# Patient Record
Sex: Male | Born: 1960 | Race: Black or African American | Hispanic: No | Marital: Single | Smoking: Never smoker
Health system: Southern US, Community
[De-identification: ages and names within clinical notes are randomized; demographics above are authoritative.]

## PROBLEM LIST (undated history)

## (undated) DIAGNOSIS — G8929 Other chronic pain: Secondary | ICD-10-CM

## (undated) DIAGNOSIS — M549 Dorsalgia, unspecified: Secondary | ICD-10-CM

## (undated) DIAGNOSIS — E785 Hyperlipidemia, unspecified: Secondary | ICD-10-CM

## (undated) DIAGNOSIS — E119 Type 2 diabetes mellitus without complications: Secondary | ICD-10-CM

## (undated) HISTORY — DX: Other chronic pain: G89.29

## (undated) HISTORY — DX: Dorsalgia, unspecified: M54.9

## (undated) HISTORY — DX: Hyperlipidemia, unspecified: E78.5

---

## 2011-10-23 ENCOUNTER — Encounter: Payer: Self-pay | Admitting: Emergency Medicine

## 2011-10-23 ENCOUNTER — Emergency Department (HOSPITAL_COMMUNITY)
Admission: EM | Admit: 2011-10-23 | Discharge: 2011-10-23 | Disposition: A | Discharge status: Home / Self Care | Payer: Self-pay | Attending: Emergency Medicine | Admitting: Emergency Medicine

## 2011-10-23 DIAGNOSIS — H1130 Conjunctival hemorrhage, unspecified eye: Secondary | ICD-10-CM | POA: Insufficient documentation

## 2011-10-23 DIAGNOSIS — H5789 Other specified disorders of eye and adnexa: Secondary | ICD-10-CM | POA: Insufficient documentation

## 2011-10-23 HISTORY — DX: Other chronic pain: G89.29

## 2011-10-23 HISTORY — DX: Dorsalgia, unspecified: M54.9

## 2011-10-23 NOTE — ED Notes (Signed)
Pt states he takes pain med for his back regularly and pain in R eye has gone from 8 to now a 3 since taking ultram this am. Pt states he had blurry vision early am but vision is back to "same now"

## 2011-10-23 NOTE — ED Notes (Signed)
Per Pt; redness noted at 0800 to right sclera accompanied by itching earlier in day. No other complaints

## 2011-10-23 NOTE — ED Provider Notes (Signed)
History     CSN: [id number] Arrival date & time: 10/23/2011  4:05 PM   First MD Initiated Contact with Patient 10/23/11 1716      Chief Complaint  Patient presents with  . Eye Problem    (Consider location/radiation/quality/duration/timing/severity/associated sxs/prior treatment) HPI Comments: Patient with area of redness noted this morning to right medial aspect of either. Patient denies eye pain or blurry vision. He denies trauma to the eye. Patient denies coughing, vomiting, any forceful injury. No fever or other systemic symptoms. Patient had similar redness several years and was treated for pinkeye.  Patient is a 50 y.o. male presenting with eye problem. The history is provided by the patient.  Eye Problem  This is a new problem. The current episode started 12 to 24 hours ago. The problem occurs constantly. The problem has not changed since onset.There is pain in the right eye. There was no injury mechanism. The patient is experiencing no pain. There is no known exposure to pink eye. He does not wear contacts. Associated symptoms include eye redness and itching. Pertinent negatives include no blurred vision, no discharge, no photophobia, no nausea and no vomiting. He has tried nothing for the symptoms.    Past Medical History  Diagnosis Date  . Asthma   . Back pain, chronic     History reviewed. No pertinent past surgical history.  History reviewed. No pertinent family history.  History  Substance Use Topics  . Smoking status: Never Smoker   . Smokeless tobacco: Not on file  . Alcohol Use: No      Review of Systems  Constitutional: Negative for fever.  HENT: Negative for ear pain, sore throat and rhinorrhea.   Eyes: Positive for redness and itching. Negative for blurred vision, photophobia, discharge and visual disturbance.  Respiratory: Negative for shortness of breath.   Cardiovascular: Negative for chest pain.  Gastrointestinal: Negative for nausea, vomiting and  abdominal pain.  Genitourinary: Negative for dysuria.  Musculoskeletal: Negative for myalgias.  Skin: Positive for itching. Negative for wound.  Neurological: Negative for dizziness and headaches.    Allergies  Review of patient's allergies indicates no known allergies.  Home Medications   Current Outpatient Rx  Name Route Sig Dispense Refill  . ALBUTEROL SULFATE HFA 108 (90 BASE) MCG/ACT IN AERS Inhalation Inhale 2 puffs into the lungs every 6 (six) hours as needed. Shortness of breath     . NABUMETONE 750 MG PO TABS Oral Take 750 mg by mouth daily.      . TRAMADOL HCL 50 MG PO TABS Oral Take 50 mg by mouth every 6 (six) hours as needed. painMaximum dose= 8 tablets per day       BP 119/82  Pulse 81  Temp(Src) 98.6 F (37 C) (Oral)  Resp 18  SpO2 96%  Physical Exam  Nursing note and vitals reviewed. Constitutional: He is oriented to person, place, and time. He appears well-developed and well-nourished.  HENT:  Head: Normocephalic and atraumatic.  Eyes: EOM are normal. Pupils are equal, round, and reactive to light. Right eye exhibits no discharge. Left eye exhibits no discharge. Right conjunctiva is not injected. Right conjunctiva has a hemorrhage. Left conjunctiva is not injected. Left conjunctiva has no hemorrhage.    Neck: Normal range of motion. Neck supple.  Musculoskeletal: He exhibits no edema.  Neurological: He is alert and oriented to person, place, and time.  Skin: Skin is warm and dry.  Psychiatric: He has a normal mood and affect.  ED Course  Procedures (including critical care time)  Labs Reviewed - No data to display No results found.   1. Subconjunctival hemorrhage    6:01 PM patient with subconjunctival hemorrhage. Counseled. Ophthalmology referral given if desired. Patient urged to return with change in vision, pain and high, or purulent discharge. Patient verbalizes understanding and agrees with the plan.   MDM  Patient with subconjunctival  hemorrhage. No history of trauma. No blood thinners. Patient with normal visual acuity and no discharge. Do not suspect iritis or conjunctivitis.        Eustace Moore [place], [place] 10/23/11 [phone]

## 2011-10-23 NOTE — ED Provider Notes (Signed)
Medical screening examination/treatment/procedure(s) were performed by non-physician practitioner and as supervising physician I was immediately available for consultation/collaboration.   Forbes Cellar, MD 10/23/11 2145

## 2012-05-20 ENCOUNTER — Encounter (HOSPITAL_COMMUNITY): Payer: Self-pay | Admitting: *Deleted

## 2012-05-20 ENCOUNTER — Emergency Department (HOSPITAL_COMMUNITY)
Admission: EM | Admit: 2012-05-20 | Discharge: 2012-05-20 | Disposition: A | Discharge status: Home / Self Care | Payer: Self-pay | Attending: Emergency Medicine | Admitting: Emergency Medicine

## 2012-05-20 ENCOUNTER — Emergency Department (HOSPITAL_COMMUNITY): Payer: Self-pay

## 2012-05-20 DIAGNOSIS — G8929 Other chronic pain: Secondary | ICD-10-CM | POA: Insufficient documentation

## 2012-05-20 DIAGNOSIS — J45909 Unspecified asthma, uncomplicated: Secondary | ICD-10-CM | POA: Insufficient documentation

## 2012-05-20 DIAGNOSIS — W268XXA Contact with other sharp object(s), not elsewhere classified, initial encounter: Secondary | ICD-10-CM | POA: Insufficient documentation

## 2012-05-20 DIAGNOSIS — S61509A Unspecified open wound of unspecified wrist, initial encounter: Secondary | ICD-10-CM | POA: Insufficient documentation

## 2012-05-20 DIAGNOSIS — T148XXA Other injury of unspecified body region, initial encounter: Secondary | ICD-10-CM

## 2012-05-20 DIAGNOSIS — Y998 Other external cause status: Secondary | ICD-10-CM | POA: Insufficient documentation

## 2012-05-20 DIAGNOSIS — Y9389 Activity, other specified: Secondary | ICD-10-CM | POA: Insufficient documentation

## 2012-05-20 NOTE — ED Notes (Signed)
Per EMS pt was closing window and when shutting glass broke and punctured R wrist, pinpoint puncture, deep, without pressure blood is flowing, pressure applied. Cap refill +, artrial pulse present, able to move all fingers, no dizziness, no nausea. 150/100, HR 104.

## 2012-05-20 NOTE — ED Notes (Signed)
Rt wrist puncture wound irrigated and cleaned, no bleeding present at the time, dressing reapplied. EDP made aware bleeding has stopped.

## 2012-05-20 NOTE — ED Notes (Signed)
[id number]<AV> Expected date:<BR> Expected time:<BR> Means of arrival:Ambulance<BR> Comments:<BR> Wrist punctured by glass

## 2012-05-20 NOTE — ED Notes (Signed)
Pt given discharge instructions/explained, escorted to discharge window, in no distress upon discharge.

## 2012-05-20 NOTE — ED Provider Notes (Signed)
History     CSN: [id number]  Arrival date & time 05/20/12  1149   First MD Initiated Contact with Patient 05/20/12 1152      No chief complaint on file.   (Consider location/radiation/quality/duration/timing/severity/associated sxs/prior treatment) HPI The patient presents immediately after suffering an accidental stab wound.  He was attempting to lift a glass pane when a broken piece fell onto his right wrist.  Instruct the mid anterior aspect of the wrist.  Subsequently the pain and bleeding began from that area.  He denies distal dysesthesia or weakness.  He also denies any other trauma.  He states that he was in his usual state of health until this occurred Past Medical History  Diagnosis Date  . Asthma   . Back pain, chronic     History reviewed. No pertinent past surgical history.  History reviewed. No pertinent family history.  History  Substance Use Topics  . Smoking status: Never Smoker   . Smokeless tobacco: Never Used  . Alcohol Use: No      Review of Systems  All other systems reviewed and are negative.    Allergies  Review of patient's allergies indicates no known allergies.  Home Medications   Current Outpatient Rx  Name Route Sig Dispense Refill  . ROSUVASTATIN CALCIUM 10 MG PO TABS Oral Take 10 mg by mouth daily.    . ALBUTEROL SULFATE HFA 108 (90 BASE) MCG/ACT IN AERS Inhalation Inhale 2 puffs into the lungs every 6 (six) hours as needed. Shortness of breath     . NABUMETONE 750 MG PO TABS Oral Take 750 mg by mouth 2 (two) times daily.     . TRAMADOL HCL 50 MG PO TABS Oral Take 50 mg by mouth every 6 (six) hours as needed. painMaximum dose= 8 tablets per day       BP 116/78  Pulse 99  Temp 97.8 F (36.6 C) (Oral)  Resp 24  SpO2 100%  Physical Exam  Nursing note and vitals reviewed. Constitutional: He appears well-developed and well-nourished. No distress.  HENT:  Head: Normocephalic and atraumatic.  Eyes: Conjunctivae and EOM are  normal.  Cardiovascular: Normal rate and regular rhythm.   Pulmonary/Chest: Effort normal. No stridor.  Musculoskeletal:       On the anterior aspect of the right wrist just lateral to the flexor digitorum profunda tendon.  Patient not visible, there is active significant bleeding or tender to palpation.  The patient has appropriate range of motion and strength of the wrist and all digits.  Distal cap refill and pulses are appropriate.  Skin: He is not diaphoretic.    ED Course  Procedures (including critical care time)  Labs Reviewed - No data to display No results found.   No diagnosis found.   1:01 PM After irrigation, and pressure, the wound has stopped bleeding. MDM  This gentleman, and in no distress, presents following a stab wound to his right wrist.  Initially there is substantial blood flowing, though no pulsatile flow.  Distal pulses, cap refill, sensation, motor are all appropriate.  X-ray does not demonstrate foreign body.  Wound is irrigated, and with pressure, stopped bleeding.  He was discharged in stable condition.  Gerhard Munch, MD 05/20/12 [phone]

## 2012-05-20 NOTE — ED Notes (Signed)
Pt states was working with an old window, went to shut and glass broke, coming down on R wrist, pinpoint laceration present, pressure dressing on wrist, when dressing removed blood flows out, is not squirting. Pt states R hand feels like it's going numb, denies dizziness. Pt states having R hand pain 10/10. Pt states small amount of blood loss at home, stated he applied pressure right after it happen. Pt a/o x 4.

## 2012-10-19 ENCOUNTER — Encounter (HOSPITAL_COMMUNITY): Payer: Self-pay

## 2012-10-19 ENCOUNTER — Emergency Department (HOSPITAL_COMMUNITY)
Admission: EM | Admit: 2012-10-19 | Discharge: 2012-10-19 | Disposition: A | Discharge status: Home / Self Care | Payer: Self-pay | Source: Home / Self Care

## 2012-10-19 DIAGNOSIS — J45909 Unspecified asthma, uncomplicated: Secondary | ICD-10-CM

## 2012-10-19 DIAGNOSIS — Z23 Encounter for immunization: Secondary | ICD-10-CM

## 2012-10-19 DIAGNOSIS — G8929 Other chronic pain: Secondary | ICD-10-CM

## 2012-10-19 DIAGNOSIS — M549 Dorsalgia, unspecified: Secondary | ICD-10-CM

## 2012-10-19 MED ORDER — TRAMADOL-ACETAMINOPHEN 37.5-325 MG PO TABS: 1.0000 | ORAL_TABLET | Freq: Four times a day (QID) | ORAL | Status: DC | PRN

## 2012-10-19 MED ORDER — INFLUENZA VIRUS VACC SPLIT PF IM SUSP
0.5000 mL | Freq: Once | INTRAMUSCULAR | Status: AC
  Administered 2012-10-19: 0.5 mL via INTRAMUSCULAR

## 2012-10-19 MED ORDER — NABUMETONE 750 MG PO TABS: 750.0000 mg | ORAL_TABLET | Freq: Two times a day (BID) | ORAL | Status: DC

## 2012-10-19 NOTE — ED Provider Notes (Signed)
History     CSN: [id number]  Arrival date & time 10/19/12  1110   First MD Initiated Contact with Patient 10/19/12 1150      Chief Complaint  Patient presents with  . Back Pain     HPI  The patient is a 51 year old African American male who has moved down to [place] from [place], he has a history of chronic back pain and is here requesting refill for his usual narcotic medications and also to see if we can arrange outpatient physical therapy. He has no fever, urinary or fecal incontinence. On neurologic exam he is adequate strength and sensation. He denies any other complaints.  Past Medical History  Diagnosis Date  . Asthma   . Back pain, chronic     History reviewed. No pertinent past surgical history.  No family history on file.  History  Substance Use Topics  . Smoking status: Never Smoker   . Smokeless tobacco: Never Used  . Alcohol Use: No      Review of Systems - No chest pain No shortness of breath No headache No urinary or fecal incontinence No fever No nausea vomiting Abdominal pain No muscle weakness  Allergies  Review of patient's allergies indicates no known allergies.  Home Medications   Current Outpatient Rx  Name  Route  Sig  Dispense  Refill  . HYPROMELLOSE 2.5 % OP SOLN   Both Eyes   Place 2 drops into both eyes 2 (two) times daily as needed. For dry eyes due to allergies         . NABUMETONE 750 MG PO TABS   Oral   Take 1 tablet (750 mg total) by mouth 2 (two) times daily.   30 tablet   0   . TRAMADOL-ACETAMINOPHEN 37.5-325 MG PO TABS   Oral   Take 1 tablet by mouth every 6 (six) hours as needed. For back pain   30 tablet   0     BP 107/70  Pulse 71  Temp 98.6 F (37 C) (Oral)  Resp 19  SpO2 98%  Physical Exam GEN exam-awake and alert Neck supple no JVD Chest bilaterally clear to auscultation CVS-S1-S2 regular no murmurs Abdomen-soft nontender Extremities-no edema Neurology-motor strength 5 x 5 in all 4  extremities. No sensory deficits. Awake and alert, cranial nerves from 2-12 intact  ED Course  Procedures (including critical care time)   Labs Reviewed  CBC  COMPREHENSIVE METABOLIC PANEL  TSH  HEMOGLOBIN A1C  LIPID PANEL   No results found.   1. Chronic back pain greater than 3 months duration   2. Asthma    Patient has a history of chronic back pain, this has been going on for 2-3 years. He is here for management of his pain and also requesting outpatient physical therapy. We have refilled his usual pain medications, a pain contract was given to the patient and he is febrile after treating it and has signed it.   We will give him a two-week supply and check his compliance with medication and followup before resuming narcotic therapy in the long run.  We will check routine lab work which includes A1c, lipid, CBC chemistries and TSH before his next visit. Also get an x-ray of his lumbosacral spine before his next visit.  Health maintenance - Flu vaccination prior to discharge today   MDM  Return to visit in 2 weeks        Maretta Bees, MD 10/19/12 1302

## 2012-10-19 NOTE — ED Notes (Signed)
C/o chronic back pain ran out of medications almost 10 months ago

## 2012-10-21 ENCOUNTER — Ambulatory Visit: Payer: Medicaid Other | Attending: Internal Medicine | Admitting: Physical Therapy

## 2012-10-21 DIAGNOSIS — IMO0001 Reserved for inherently not codable concepts without codable children: Secondary | ICD-10-CM | POA: Insufficient documentation

## 2012-10-21 DIAGNOSIS — M545 Low back pain, unspecified: Secondary | ICD-10-CM | POA: Insufficient documentation

## 2012-10-21 DIAGNOSIS — M25659 Stiffness of unspecified hip, not elsewhere classified: Secondary | ICD-10-CM | POA: Insufficient documentation

## 2012-10-29 ENCOUNTER — Ambulatory Visit: Payer: Medicaid Other | Admitting: Rehabilitation

## 2012-11-02 ENCOUNTER — Encounter (HOSPITAL_COMMUNITY): Payer: Self-pay

## 2012-11-02 ENCOUNTER — Emergency Department (HOSPITAL_COMMUNITY)
Admission: EM | Admit: 2012-11-02 | Discharge: 2012-11-02 | Disposition: A | Discharge status: Home / Self Care | Payer: Medicaid Other | Source: Home / Self Care

## 2012-11-02 DIAGNOSIS — M549 Dorsalgia, unspecified: Secondary | ICD-10-CM

## 2012-11-02 DIAGNOSIS — G8929 Other chronic pain: Secondary | ICD-10-CM

## 2012-11-02 LAB — COMPREHENSIVE METABOLIC PANEL
ALT: 43 U/L (ref 0–53)
AST: 33 U/L (ref 0–37)
Albumin: 4.5 g/dL (ref 3.5–5.2)
Alkaline Phosphatase: 82 U/L (ref 39–117)
BUN: 15 mg/dL (ref 6–23)
CO2: 28 mEq/L (ref 19–32)
Calcium: 9.7 mg/dL (ref 8.4–10.5)
Chloride: 102 mEq/L (ref 96–112)
Creatinine, Ser: 1.12 mg/dL (ref 0.50–1.35)
GFR calc Af Amer: 87 mL/min — ABNORMAL LOW (ref >90)
GFR calc non Af Amer: 75 mL/min — ABNORMAL LOW (ref >90)
Glucose, Bld: 90 mg/dL (ref 70–99)
Potassium: 4.1 mEq/L (ref 3.5–5.1)
Sodium: 140 mEq/L (ref 135–145)
Total Bilirubin: 0.5 mg/dL (ref 0.3–1.2)
Total Protein: 7.8 g/dL (ref 6.0–8.3)

## 2012-11-02 LAB — CBC
HCT: 45.6 % (ref 39.0–52.0)
Hemoglobin: 15.9 g/dL (ref 13.0–17.0)
MCH: 30.8 pg (ref 26.0–34.0)
MCHC: 34.9 g/dL (ref 30.0–36.0)
MCV: 88.4 fL (ref 78.0–100.0)
Platelets: 232 10*3/uL (ref 150–400)
RBC: 5.16 MIL/uL (ref 4.22–5.81)
RDW: 13 % (ref 11.5–15.5)
WBC: 3.8 10*3/uL — ABNORMAL LOW (ref 4.0–10.5)

## 2012-11-02 LAB — HEMOGLOBIN A1C
Hgb A1c MFr Bld: 5.9 % — ABNORMAL HIGH (ref <5.7)
Mean Plasma Glucose: 123 mg/dL — ABNORMAL HIGH (ref <117)

## 2012-11-02 LAB — TSH: TSH: 2.044 u[IU]/mL (ref 0.350–4.500)

## 2012-11-02 NOTE — ED Notes (Signed)
Follow up back pain

## 2012-11-02 NOTE — ED Provider Notes (Signed)
Patient Demographics  Shane Oliver, is a 51 y.o. male  WGN:[id number]  HQI:[id number]  DOB - [birth year]  Chief Complaint  Patient presents with  . Follow-up        Subjective:   Ravon Mortellaro today has, No headache, No chest pain, No abdominal pain - No Nausea, No new weakness tingling or numbness, No Cough - SOB. Chronic nonradiating lower L-spine area back pain, with no associated weakness, no aggravating or relieving factors.  Objective:    Filed Vitals:   11/02/12 1044  BP: 105/69  Pulse: 75  Temp: 98.7 F (37.1 C)  TempSrc: Oral  Resp: 20  SpO2: 100%     Exam  Awake Alert, Oriented X 3, No new F.N deficits, Normal affect [place].AT,PERRAL Supple Neck,No JVD, No cervical lymphadenopathy appriciated.  Symmetrical Chest wall movement, Good air movement bilaterally, CTAB RRR,No Gallops,Rubs or new Murmurs, No Parasternal Heave +ve B.Sounds, Abd Soft, Non tender, No organomegaly appriciated, No rebound - guarding or rigidity. No Cyanosis, Clubbing or edema, No new Rash or bruise      Data Review   CBC No results found for this basename: WBC:5,HGB:5,HCT:5,PLT:5,MCV:5,MCH:5,MCHC:5,RDW:5,NEUTRABS:5,LYMPHSABS:5,MONOABS:5,EOSABS:5,BASOSABS:5,BANDABS:5,BANDSABD:5 in the last 168 hours  Chemistries   No results found for this basename: NA:5,K:5,CL:5,CO2:5,GLUCOSE:5,BUN:5,CREATININE:5,GFRCGP,:5,CALCIUM:5,MG:5,AST:5,ALT:5,ALKPHOS:5,BILITOT:5 in the last 168 hours ------------------------------------------------------------------------------------------------------------------ No results found for this basename: HGBA1C:2 in the last 72 hours ------------------------------------------------------------------------------------------------------------------ No results found for this basename: CHOL:2,HDL:2,LDLCALC:2,TRIG:2,CHOLHDL:2,LDLDIRECT:2 in the last 72  hours ------------------------------------------------------------------------------------------------------------------ No results found for this basename: TSH,T4TOTAL,FREET3,T3FREE,THYROIDAB in the last 72 hours ------------------------------------------------------------------------------------------------------------------ No results found for this basename: VITAMINB12:2,FOLATE:2,FERRITIN:2,TIBC:2,IRON:2,RETICCTPCT:2 in the last 72 hours  Coagulation profile  No results found for this basename: INR:5,PROTIME:5 in the last 168 hours     Prior to Admission medications   Medication Sig Start Date End Date Taking? Authorizing Provider  hydroxypropyl methylcellulose (ISOPTO TEARS) 2.5 % ophthalmic solution Place 2 drops into both eyes 2 (two) times daily as needed. For dry eyes due to allergies    Historical Provider, MD  nabumetone (RELAFEN) 750 MG tablet Take 1 tablet (750 mg total) by mouth 2 (two) times daily. 10/19/12   Shanker Levora Dredge, MD  traMADol-acetaminophen (ULTRACET) 37.5-325 MG per tablet Take 1 tablet by mouth every 6 (six) hours as needed. For back pain 10/19/12   Maretta Bees, MD     Assessment & Plan   Patient with chronic back pain who has a pain contract with clinic, has recently been provided a medications for one month our clinic comes back for routine blood work check.  He has no subjective complaints whatsoever, for his back pain he is taking above dictated medications with good effect, denies any weakness urinary or bowel incontinence, he is following with outpatient physical therapy.   Routine blood work has been ordered today including CBC, CMP, A1c and lipid panel. He will come back in a few days for blood work followup.    Follow-up Information    Follow up with [facility] 1 week for lab result follow up.          Leroy Sea M.D on 11/02/2012 at 11:02 AM   Leroy Sea, MD 11/02/12 [phone]

## 2012-11-05 ENCOUNTER — Ambulatory Visit: Payer: Medicaid Other | Attending: Internal Medicine | Admitting: Physical Therapy

## 2012-11-05 DIAGNOSIS — M25659 Stiffness of unspecified hip, not elsewhere classified: Secondary | ICD-10-CM | POA: Insufficient documentation

## 2012-11-05 DIAGNOSIS — IMO0001 Reserved for inherently not codable concepts without codable children: Secondary | ICD-10-CM | POA: Insufficient documentation

## 2012-11-05 DIAGNOSIS — M545 Low back pain, unspecified: Secondary | ICD-10-CM | POA: Insufficient documentation

## 2012-11-12 ENCOUNTER — Ambulatory Visit: Payer: Medicaid Other | Admitting: Rehabilitation

## 2012-11-19 NOTE — ED Notes (Signed)
Referred to physical therapy had an appt 10/21/12 @ 11am. Pt was seen for his chronic back pain

## 2012-12-02 ENCOUNTER — Ambulatory Visit: Payer: Medicaid Other | Admitting: Family Medicine

## 2012-12-11 ENCOUNTER — Encounter (HOSPITAL_COMMUNITY): Payer: Self-pay | Admitting: *Deleted

## 2012-12-11 ENCOUNTER — Emergency Department (HOSPITAL_COMMUNITY)
Admission: EM | Admit: 2012-12-11 | Discharge: 2012-12-11 | Disposition: A | Discharge status: Home / Self Care | Payer: Medicaid Other | Attending: Emergency Medicine | Admitting: Emergency Medicine

## 2012-12-11 DIAGNOSIS — Z79899 Other long term (current) drug therapy: Secondary | ICD-10-CM | POA: Insufficient documentation

## 2012-12-11 DIAGNOSIS — G8929 Other chronic pain: Secondary | ICD-10-CM | POA: Insufficient documentation

## 2012-12-11 DIAGNOSIS — IMO0002 Reserved for concepts with insufficient information to code with codable children: Secondary | ICD-10-CM | POA: Insufficient documentation

## 2012-12-11 DIAGNOSIS — J45909 Unspecified asthma, uncomplicated: Secondary | ICD-10-CM | POA: Insufficient documentation

## 2012-12-11 DIAGNOSIS — H209 Unspecified iridocyclitis: Secondary | ICD-10-CM | POA: Insufficient documentation

## 2012-12-11 DIAGNOSIS — M479 Spondylosis, unspecified: Secondary | ICD-10-CM

## 2012-12-11 MED ORDER — DIAZEPAM 2 MG PO TABS
2.0000 mg | ORAL_TABLET | Freq: Once | ORAL | Status: AC
  Administered 2012-12-11: 2 mg via ORAL
  Filled 2012-12-11: qty 1

## 2012-12-11 MED ORDER — HYDROCODONE-ACETAMINOPHEN 5-325 MG PO TABS: 1.0000 | ORAL_TABLET | Freq: Four times a day (QID) | ORAL | Status: DC | PRN

## 2012-12-11 MED ORDER — HYDROCODONE-ACETAMINOPHEN 5-325 MG PO TABS
2.0000 | ORAL_TABLET | Freq: Once | ORAL | Status: AC
  Administered 2012-12-11: 2 via ORAL
  Filled 2012-12-11: qty 2

## 2012-12-11 MED ORDER — IBUPROFEN 600 MG PO TABS: 600.0000 mg | ORAL_TABLET | Freq: Four times a day (QID) | ORAL | Status: DC | PRN

## 2012-12-11 MED ORDER — TETRACAINE HCL 0.5 % OP SOLN
1.0000 [drp] | Freq: Once | OPHTHALMIC | Status: AC
  Administered 2012-12-11: 2 [drp] via OPHTHALMIC
  Filled 2012-12-11: qty 2

## 2012-12-11 MED ORDER — IBUPROFEN 200 MG PO TABS
600.0000 mg | ORAL_TABLET | Freq: Once | ORAL | Status: AC
  Administered 2012-12-11: 600 mg via ORAL
  Filled 2012-12-11: qty 3
  Filled 2012-12-11: qty 1

## 2012-12-11 MED ORDER — FLUORESCEIN SODIUM 1 MG OP STRP
1.0000 | ORAL_STRIP | Freq: Once | OPHTHALMIC | Status: AC
  Administered 2012-12-11: 1 via OPHTHALMIC
  Filled 2012-12-11: qty 2

## 2012-12-11 NOTE — ED Notes (Signed)
I did his eye exam his right eye he was 20/20 left eye 20/13.

## 2012-12-11 NOTE — ED Notes (Signed)
Pt c/o L Eye pain and back pain; pt reports back pain started after assisting w/ moving friend.  Left eye noted to be reddened.

## 2012-12-11 NOTE — ED Provider Notes (Signed)
History     CSN: [id number]  Arrival date & time 12/11/12  9562   First MD Initiated Contact with Patient 12/11/12 0759      No chief complaint on file.   (Consider location/radiation/quality/duration/timing/severity/associated sxs/prior treatment) HPI Comments: Pt comes in with cc of left eye pain. Pt states that he woke up with ey eredness and pain. The pain is throbbing, worse with light. There is no n/v/f/c/headaches. Pt does think that his vision is slightly blurry on that side. Pt also c/o some back pain. He has chronic back pain, and is taking tramadol. He was moving some furniture 4 days ago, and that started his pain. No new associated numbness, weakness, urinary incontinence, urinary retention, bowel incontinence, saddle anesthesia.     The history is provided by the patient.    Past Medical History  Diagnosis Date  . Asthma   . Back pain, chronic     History reviewed. No pertinent past surgical history.  History reviewed. No pertinent family history.  History  Substance Use Topics  . Smoking status: Never Smoker   . Smokeless tobacco: Never Used  . Alcohol Use: No      Review of Systems  Constitutional: Negative for fever, chills and activity change.  HENT: Negative for neck pain.   Eyes: Positive for photophobia, pain, discharge, redness, itching and visual disturbance.  Respiratory: Negative for cough, chest tightness and shortness of breath.   Cardiovascular: Negative for chest pain.  Gastrointestinal: Negative for abdominal distention.  Genitourinary: Negative for dysuria, enuresis and difficulty urinating.  Musculoskeletal: Negative for arthralgias.  Neurological: Negative for dizziness, light-headedness and headaches.  Psychiatric/Behavioral: Negative for confusion.    Allergies  Review of patient's allergies indicates no known allergies.  Home Medications   Current Outpatient Rx  Name  Route  Sig  Dispense  Refill  . ALBUTEROL SULFATE  HFA 108 (90 BASE) MCG/ACT IN AERS   Inhalation   Inhale 2 puffs into the lungs every 6 (six) hours as needed. For shortness of breath         . ADULT MULTIVITAMIN W/MINERALS CH   Oral   Take 1 tablet by mouth every morning.         Marland Kitchen NABUMETONE 750 MG PO TABS   Oral   Take 1 tablet (750 mg total) by mouth 2 (two) times daily.   30 tablet   0   . TRAMADOL-ACETAMINOPHEN 37.5-325 MG PO TABS   Oral   Take 1 tablet by mouth every 6 (six) hours as needed. For back pain           BP 126/85  Pulse 75  Temp 98.2 F (36.8 C) (Oral)  Resp 20  SpO2 100%  Physical Exam  Nursing note and vitals reviewed. Constitutional: He is oriented to person, place, and time. He appears well-developed.  HENT:  Head: Normocephalic and atraumatic.  Eyes: Pupils are equal, round, and reactive to light.       Sclera is injected, mild sub conjunctival hemorrhage, no periorbital erythema. Pt has photosensitivity. No foreign body on eversion of lid. Eye pressures - 18, 20 on the left eye Slit lamp exam with fluorecin - no abrasions, ulcers appreciated. No cell flare   Neck: Normal range of motion. Neck supple.  Cardiovascular: Normal rate and regular rhythm.   Pulmonary/Chest: Effort normal and breath sounds normal.  Abdominal: Soft. Bowel sounds are normal. He exhibits no distension. There is no tenderness. There is no rebound and no guarding.  Neurological: He is alert and oriented to person, place, and time.  Skin: Skin is warm.    ED Course  Procedures (including critical care time)  Labs Reviewed - No data to display No results found.   No diagnosis found.    MDM  Pt comes in with cc of left eye pain and redness. Also has some visual complains associated with that side.  Eye exam not indicative of any etiology.  The tearing is clear, and not purulent. No foreign bodies, no ulcer, abrasions. 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  Will get optho f.u.  Back pain - chronic issue, with a flare up and no neuro findings associated with it. Will advocate pain control and exercises.  Derwood Kaplan, MD 12/11/12 [phone]

## 2012-12-11 NOTE — ED Notes (Signed)
Discharge instructions explained to pt and a copy of instructions were given to pt along w/ prescriptions for Norco and Motrin.  Pt verbalized understanding instructions.

## 2012-12-25 ENCOUNTER — Emergency Department (HOSPITAL_COMMUNITY)
Admission: EM | Admit: 2012-12-25 | Discharge: 2012-12-25 | Disposition: A | Discharge status: Home / Self Care | Payer: Medicaid Other | Source: Home / Self Care | Attending: Family Medicine | Admitting: Family Medicine

## 2012-12-25 ENCOUNTER — Encounter (HOSPITAL_COMMUNITY): Payer: Self-pay

## 2012-12-25 DIAGNOSIS — M549 Dorsalgia, unspecified: Secondary | ICD-10-CM

## 2012-12-25 DIAGNOSIS — G8929 Other chronic pain: Secondary | ICD-10-CM

## 2012-12-25 DIAGNOSIS — J45909 Unspecified asthma, uncomplicated: Secondary | ICD-10-CM

## 2012-12-25 DIAGNOSIS — R7303 Prediabetes: Secondary | ICD-10-CM

## 2012-12-25 LAB — LIPID PANEL
Cholesterol: 246 mg/dL — ABNORMAL HIGH (ref 0–200)
HDL: 59 mg/dL (ref >39)
LDL Cholesterol: 149 mg/dL — ABNORMAL HIGH (ref 0–99)
Total CHOL/HDL Ratio: 4.2 RATIO
Triglycerides: 190 mg/dL — ABNORMAL HIGH (ref <150)
VLDL: 38 mg/dL (ref 0–40)

## 2012-12-25 MED ORDER — NABUMETONE 750 MG PO TABS: 750.0000 mg | ORAL_TABLET | Freq: Two times a day (BID) | ORAL | Status: DC | PRN

## 2012-12-25 MED ORDER — TRAMADOL-ACETAMINOPHEN 37.5-325 MG PO TABS: 1.0000 | ORAL_TABLET | Freq: Four times a day (QID) | ORAL | Status: DC | PRN

## 2012-12-25 NOTE — ED Notes (Signed)
Follow up Medication refill 

## 2012-12-25 NOTE — ED Provider Notes (Signed)
History     CSN: [id number]  Arrival date & time 12/25/12  1553   First MD Initiated Contact with Patient 12/25/12 1650      Chief Complaint  Patient presents with  . Medication Refill    (Consider location/radiation/quality/duration/timing/severity/associated sxs/prior treatment) HPI The patient reports that he's been doing fairly well.  He reports that he has not been as physically active as he would like to be because of chronic back pain.  He reports that he has been stable on his medications.  He is asking for refills.  The patient reports that he has had no exacerbations in asthma symptoms.  Past Medical History  Diagnosis Date  . Asthma   . Back pain, chronic     History reviewed. No pertinent past surgical history.  No family history on file.  History  Substance Use Topics  . Smoking status: Never Smoker   . Smokeless tobacco: Never Used  . Alcohol Use: No    Review of Systems  Constitutional: Negative.   Musculoskeletal: Positive for back pain and arthralgias.  All other systems reviewed and are negative.    Allergies  Review of patient's allergies indicates no known allergies.  Home Medications   Current Outpatient Rx  Name  Route  Sig  Dispense  Refill  . albuterol (PROVENTIL HFA;VENTOLIN HFA) 108 (90 BASE) MCG/ACT inhaler   Inhalation   Inhale 2 puffs into the lungs every 6 (six) hours as needed. For shortness of breath         . Multiple Vitamin (MULTIVITAMIN WITH MINERALS) TABS   Oral   Take 1 tablet by mouth every morning.         . nabumetone (RELAFEN) 750 MG tablet   Oral   Take 1 tablet (750 mg total) by mouth 2 (two) times daily as needed for pain.   30 tablet   1   . traMADol-acetaminophen (ULTRACET) 37.5-325 MG per tablet   Oral   Take 1 tablet by mouth every 6 (six) hours as needed. For back pain   30 tablet   1     BP 125/79  Pulse 77  Temp(Src) 98.1 F (36.7 C) (Oral)  SpO2 100%  Physical Exam  Nursing note and  vitals reviewed. Constitutional: He appears well-developed and well-nourished. No distress.  HENT:  Head: Normocephalic and atraumatic.  Eyes: EOM are normal. Pupils are equal, round, and reactive to light.  Neck: Normal range of motion. No JVD present. No tracheal deviation present. No thyromegaly present.  Cardiovascular: Normal rate, regular rhythm and normal heart sounds.   Pulmonary/Chest: Effort normal and breath sounds normal. No respiratory distress. He has no wheezes. He has no rales. He exhibits no tenderness.  Abdominal: Soft. Bowel sounds are normal.  Musculoskeletal:       Right shoulder: He exhibits no swelling.       Lumbar back: He exhibits decreased range of motion, tenderness, bony tenderness, laceration, pain and spasm. He exhibits no swelling, no edema, no deformity and normal pulse.       Arms: Lymphadenopathy:    He has no cervical adenopathy.  Skin: Skin is warm and dry.  Psychiatric: He has a normal mood and affect. His behavior is normal. Judgment and thought content normal.    ED Course  Procedures (including critical care time)  Labs Reviewed  LIPID PANEL   No results found.  IMPRESSION 1. Chronic back pain   2. Prediabetes   3. Asthma -stable  MDM   RECOMMENDATIONS / PLAN Check lipid panel today  Reviewed labs with patient and discussed his prediabetes. Gave patient information on his prediabetes diet and physical activity Refilled ultracet and relafen for his chronic back pain condition Advised that he can't take ibuprofen with these medications, pt verbalized understanding  FOLLOW UP 2 months   The patient was given clear instructions to go to ER or return to medical center if symptoms don't improve, worsen or new problems develop.  The patient verbalized understanding.  The patient was told to call to get lab results if they haven't heard anything in the next week.            Cleora Fleet, MD 12/25/12 2122

## 2012-12-26 ENCOUNTER — Encounter: Payer: Self-pay | Admitting: Family Medicine

## 2012-12-26 DIAGNOSIS — E7800 Pure hypercholesterolemia, unspecified: Secondary | ICD-10-CM | POA: Insufficient documentation

## 2012-12-26 DIAGNOSIS — R7303 Prediabetes: Secondary | ICD-10-CM | POA: Insufficient documentation

## 2012-12-26 DIAGNOSIS — E785 Hyperlipidemia, unspecified: Secondary | ICD-10-CM

## 2012-12-26 DIAGNOSIS — G8929 Other chronic pain: Secondary | ICD-10-CM

## 2012-12-26 DIAGNOSIS — M549 Dorsalgia, unspecified: Secondary | ICD-10-CM | POA: Insufficient documentation

## 2012-12-26 HISTORY — DX: Hyperlipidemia, unspecified: E78.5

## 2012-12-26 HISTORY — DX: Other chronic pain: G89.29

## 2012-12-26 NOTE — Progress Notes (Signed)
Quick Note:  Please inform patient that his cholesterol levels came back mildly elevated. His total cholesterol is 246 and his LDL (bad) cholesterol is 149. I would like to see him have a closer to 200 or below. I would like for him to have an LDL cholesterol closer to 409 because he has prediabetes. I recommend patient watch his cholesterol intake and try a low cholesterol diet. Also, when your blood sugars improve, your cholesterol usually improves. Recheck labs in 3 months.   Shane Langton, MD, CDE, FAAFP [facility] [place], [place]   ______

## 2013-08-12 ENCOUNTER — Other Ambulatory Visit: Payer: Self-pay | Admitting: Family Medicine

## 2013-08-12 NOTE — Telephone Encounter (Signed)
Pt requesting refill

## 2013-08-19 NOTE — Telephone Encounter (Signed)
Pt needs office visit

## 2013-09-06 ENCOUNTER — Telehealth: Payer: Self-pay | Admitting: *Deleted

## 2013-09-06 NOTE — Telephone Encounter (Signed)
[facility] calling to get NPI # for one time visit.  They will instruct patient contact DSS to have PCP changed on card.  NPI # given.  Gaylene Brooks, RN

## 2014-05-28 ENCOUNTER — Emergency Department (HOSPITAL_COMMUNITY)
Admission: EM | Admit: 2014-05-28 | Discharge: 2014-05-28 | Disposition: A | Discharge status: Home / Self Care | Payer: Medicaid Other | Attending: Emergency Medicine | Admitting: Emergency Medicine

## 2014-05-28 ENCOUNTER — Encounter (HOSPITAL_COMMUNITY): Payer: Self-pay | Admitting: Emergency Medicine

## 2014-05-28 DIAGNOSIS — IMO0002 Reserved for concepts with insufficient information to code with codable children: Secondary | ICD-10-CM | POA: Insufficient documentation

## 2014-05-28 DIAGNOSIS — M5416 Radiculopathy, lumbar region: Secondary | ICD-10-CM

## 2014-05-28 DIAGNOSIS — Z79899 Other long term (current) drug therapy: Secondary | ICD-10-CM | POA: Insufficient documentation

## 2014-05-28 DIAGNOSIS — J45909 Unspecified asthma, uncomplicated: Secondary | ICD-10-CM | POA: Insufficient documentation

## 2014-05-28 DIAGNOSIS — E785 Hyperlipidemia, unspecified: Secondary | ICD-10-CM | POA: Insufficient documentation

## 2014-05-28 DIAGNOSIS — M549 Dorsalgia, unspecified: Secondary | ICD-10-CM | POA: Insufficient documentation

## 2014-05-28 DIAGNOSIS — Z791 Long term (current) use of non-steroidal anti-inflammatories (NSAID): Secondary | ICD-10-CM | POA: Diagnosis not present

## 2014-05-28 DIAGNOSIS — G8929 Other chronic pain: Secondary | ICD-10-CM | POA: Diagnosis not present

## 2014-05-28 MED ORDER — PREDNISONE 20 MG PO TABS: 60.0000 mg | ORAL_TABLET | Freq: Every day | ORAL | Status: DC

## 2014-05-28 MED ORDER — HYDROCODONE-ACETAMINOPHEN 5-325 MG PO TABS
1.0000 | ORAL_TABLET | Freq: Once | ORAL | Status: AC
  Administered 2014-05-28: 1 via ORAL
  Filled 2014-05-28: qty 1

## 2014-05-28 MED ORDER — PREDNISONE 20 MG PO TABS
60.0000 mg | ORAL_TABLET | Freq: Once | ORAL | Status: AC
  Administered 2014-05-28: 60 mg via ORAL
  Filled 2014-05-28: qty 3

## 2014-05-28 MED ORDER — HYDROCODONE-ACETAMINOPHEN 5-325 MG PO TABS: ORAL_TABLET | ORAL | Status: DC

## 2014-05-28 NOTE — ED Provider Notes (Signed)
CSN: [id number]     Arrival date & time 05/28/14  1027 History   First MD Initiated Contact with Patient 05/28/14 1043     Chief Complaint  Patient presents with  . Back Pain     (Consider location/radiation/quality/duration/timing/severity/associated sxs/prior Treatment) HPI  Shane Oliver is a 53 y.o. male complaining of exacerbation of chronic back pain which she's had since he was a child. Patient states the pain has increased significantly starting yesterday. Denies trauma. Pain is rated at 7/10, states that he has an appointment with pain management but that won't be until October. States his primary care physician will not write him for pain medication.Denies fever, chills, change in bowel or bladder habits, h/o IDVU or cancer, numbness or weakness.    Past Medical History  Diagnosis Date  . Asthma   . Back pain, chronic   . Hyperlipidemia LDL goal < 100 12/26/2012  . Chronic back pain 12/26/2012   No past surgical history on file. No family history on file. History  Substance Use Topics  . Smoking status: Never Smoker   . Smokeless tobacco: Never Used  . Alcohol Use: No    Review of Systems  10 systems reviewed and found to be negative, except as noted in the HPI.   Allergies  Other  Home Medications   Prior to Admission medications   Medication Sig Start Date End Date Taking? Authorizing Provider  albuterol (PROVENTIL HFA;VENTOLIN HFA) 108 (90 BASE) MCG/ACT inhaler Inhale 2 puffs into the lungs every 6 (six) hours as needed. For shortness of breath   Yes Historical Provider, MD  cholecalciferol (VITAMIN D) 1000 UNITS tablet Take 1,000 Units by mouth daily.   Yes Historical Provider, MD  EPINEPHrine (EPIPEN) 0.3 mg/0.3 mL IJ SOAJ injection Inject 0.3 mg into the muscle once.   Yes Historical Provider, MD  gabapentin (NEURONTIN) 300 MG capsule Take 300 mg by mouth 3 (three) times daily.   Yes Historical Provider, MD  ibuprofen (ADVIL,MOTRIN) 200 MG tablet Take  400 mg by mouth every 6 (six) hours as needed for moderate pain.   Yes Historical Provider, MD  nabumetone (RELAFEN) 750 MG tablet Take 1 tablet (750 mg total) by mouth 2 (two) times daily as needed for pain. 12/25/12  Yes Cl[place] Cyndie Mull, MD  pravastatin (PRAVACHOL) 20 MG tablet Take 20 mg by mouth daily.   Yes Historical Provider, MD  HYDROcodone-acetaminophen (NORCO/VICODIN) 5-325 MG per tablet Take 1-2 tablets by mouth every 6 hours as needed for pain. 05/28/14   Holbert Caples, PA-C  predniSONE (DELTASONE) 20 MG tablet Take 3 tablets (60 mg total) by mouth daily. Take 60 mg by mouth daily week 1, then 40mg  by mouth daily for week 2, then 20mg  daily for week 3 05/28/14   Joni Reining Jon Lall, PA-C   BP 120/88  Pulse 86  Temp(Src) 98.4 F (36.9 C) (Oral)  Resp 18  SpO2 100% Physical Exam  Nursing note and vitals reviewed. Constitutional: He appears well-developed and well-nourished.  HENT:  Head: Normocephalic.  Eyes: Conjunctivae are normal.  Neck: Normal range of motion.  Cardiovascular: Normal rate, regular rhythm and intact distal pulses.   Pulmonary/Chest: Effort normal.  Abdominal: Soft. There is no tenderness.  Neurological: He is alert.  No point tenderness to percussion of lumbar spinal processes.  No TTP or paraspinal muscular spasm. Strength is 5 out of 5 to bilateral lower extremities at hip and knee; extensor hallucis longus 5 out of 5. Ankle strength 5 out of 5,  no clonus, neurovascularly intact. No saddle anaesthesia. Patellar reflexes are 2+ bilaterally.    Ambulates with nonantalgic gait   Psychiatric: He has a normal mood and affect.    ED Course  Procedures (including critical care time) Labs Review Labs Reviewed - No data to display  Imaging Review No results found.   EKG Interpretation None      MDM   Final diagnoses:  Lumbar radiculopathy, chronic    Filed Vitals:   05/28/14 1034  BP: 120/88  Pulse: 86  Temp: 98.4 F (36.9 C)  TempSrc:  Oral  Resp: 18  SpO2: 100%    Medications  predniSONE (DELTASONE) tablet 60 mg (60 mg Oral Given 05/28/14 1104)  HYDROcodone-acetaminophen (NORCO/VICODIN) 5-325 MG per tablet 1 tablet (1 tablet Oral Given 05/28/14 1104)    Shane Oliver is a 53 y.o. male presenting with  back pain.  No neurological deficits and normal neuro exam.  Patient can walk but states is painful.  No loss of bowel or bladder control.  No concern for cauda equina.  No fever, night sweats, weight loss, h/o cancer, IVDU.  RICE protocol and pain medicine indicated and discussed with patient.  Evaluation does not show pathology that would require ongoing emergent intervention or inpatient treatment. Pt is hemodynamically stable and mentating appropriately. Discussed findings and plan with patient/guardian, who agrees with care plan. All questions answered. Return precautions discussed and outpatient follow up given.   Discharge Medication List as of 05/28/2014 11:04 AM    START taking these medications   Details  HYDROcodone-acetaminophen (NORCO/VICODIN) 5-325 MG per tablet Take 1-2 tablets by mouth every 6 hours as needed for pain., Print    predniSONE (DELTASONE) 20 MG tablet Take 3 tablets (60 mg total) by mouth daily. Take 60 mg by mouth daily week 1, then 40mg  by mouth daily for week 2, then 20mg  daily for week 3, Starting 05/28/2014, Until Discontinued, Print             Shane Emeryicole Sharonda Llamas, PA-C 05/29/14 1956

## 2014-05-28 NOTE — Discharge Instructions (Signed)
Please take ibuprofen 400mg  (this is normally 2 over the counter pills) every 6 hours (take with food to minimze stomach irritation).   Take Vicodin for breakthrough pain, do not drink alcohol, drive, care for children or perfom other critical tasks while taking vicodin.  Please follow with your primary care doctor in the next 2 days for a check-up. They must obtain records for further management.   Do not hesitate to return to the Emergency Department for any new, worsening or concerning symptoms.    Lumbosacral Radiculopathy Lumbosacral radiculopathy is a pinched nerve or nerves in the low back (lumbosacral area). When this happens you may have weakness in your legs and may not be able to stand on your toes. You may have pain going down into your legs. There may be difficulties with walking normally. There are many causes of this problem. Sometimes this may happen from an injury, or simply from arthritis or boney problems. It may also be caused by other illnesses such as diabetes. If there is no improvement after treatment, further studies may be done to find the exact cause. DIAGNOSIS  X-rays may be needed if the problems become long standing. Electromyograms may be done. This study is one in which the working of nerves and muscles is studied. HOME CARE INSTRUCTIONS   Applications of ice packs may be helpful. Ice can be used in a plastic bag with a towel around it to prevent frostbite to skin. This may be used every 2 hours for 20 to 30 minutes, or as needed, while awake, or as directed by your caregiver.  Only take over-the-counter or prescription medicines for pain, discomfort, or fever as directed by your caregiver.  If physical therapy was prescribed, follow your caregiver's directions. SEEK IMMEDIATE MEDICAL CARE IF:   You have pain not controlled with medications.  You seem to be getting worse rather than better.  You develop increasing weakness in your legs.  You develop loss of  bowel or bladder control.  You have difficulty with walking or balance, or develop clumsiness in the use of your legs.  You have a fever. MAKE SURE YOU:   Understand these instructions.  Will watch your condition.  Will get help right away if you are not doing well or get worse. Document Released: 10/21/2005 Document Revised: 01/13/2012 Document Reviewed: 06/10/2008 [facility] Patient Information 2015 [employer], [place]. This information is not intended to replace advice given to you by your health care provider. Make sure you discuss any questions you have with your health care provider.

## 2014-05-28 NOTE — ED Notes (Addendum)
Pt c/o back pain x 38 years.  C/o increased pain since yesterday.  Denies injury.

## 2014-05-30 NOTE — ED Provider Notes (Signed)
Medical screening examination/treatment/procedure(s) were performed by non-physician practitioner and as supervising physician I was immediately available for consultation/collaboration.    Linwood DibblesJon Uri Turnbough, MD 05/30/14 [phone]

## 2015-01-03 ENCOUNTER — Other Ambulatory Visit (HOSPITAL_COMMUNITY): Payer: Self-pay | Admitting: Orthopaedic Surgery

## 2015-01-03 DIAGNOSIS — M5440 Lumbago with sciatica, unspecified side: Secondary | ICD-10-CM

## 2015-01-17 ENCOUNTER — Ambulatory Visit (HOSPITAL_COMMUNITY)
Admission: RE | Admit: 2015-01-17 | Discharge: 2015-01-17 | Disposition: A | Discharge status: Home / Self Care | Payer: Medicaid Other | Source: Ambulatory Visit | Attending: Orthopaedic Surgery | Admitting: Orthopaedic Surgery

## 2015-01-17 DIAGNOSIS — M5440 Lumbago with sciatica, unspecified side: Secondary | ICD-10-CM | POA: Diagnosis not present

## 2016-08-30 ENCOUNTER — Ambulatory Visit: Payer: Medicaid Other | Attending: Anesthesiology

## 2016-08-30 DIAGNOSIS — R293 Abnormal posture: Secondary | ICD-10-CM | POA: Diagnosis present

## 2016-08-30 DIAGNOSIS — M2560 Stiffness of unspecified joint, not elsewhere classified: Secondary | ICD-10-CM

## 2016-08-30 DIAGNOSIS — M5442 Lumbago with sciatica, left side: Secondary | ICD-10-CM | POA: Insufficient documentation

## 2016-08-30 DIAGNOSIS — M25651 Stiffness of right hip, not elsewhere classified: Secondary | ICD-10-CM | POA: Diagnosis present

## 2016-08-30 DIAGNOSIS — M25652 Stiffness of left hip, not elsewhere classified: Secondary | ICD-10-CM | POA: Diagnosis present

## 2016-08-30 DIAGNOSIS — G8929 Other chronic pain: Secondary | ICD-10-CM
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
## 2017-08-26 ENCOUNTER — Emergency Department (HOSPITAL_COMMUNITY)
Admission: EM | Admit: 2017-08-26 | Discharge: 2017-08-26 | Disposition: A | Discharge status: Home / Self Care | Payer: Medicaid Other | Attending: Emergency Medicine | Admitting: Emergency Medicine

## 2017-08-26 ENCOUNTER — Encounter (HOSPITAL_COMMUNITY): Payer: Self-pay

## 2017-08-26 DIAGNOSIS — M545 Low back pain: Secondary | ICD-10-CM | POA: Diagnosis present

## 2017-08-26 DIAGNOSIS — M5442 Lumbago with sciatica, left side: Secondary | ICD-10-CM | POA: Insufficient documentation

## 2017-08-26 DIAGNOSIS — J45909 Unspecified asthma, uncomplicated: Secondary | ICD-10-CM | POA: Insufficient documentation

## 2017-08-26 DIAGNOSIS — G8929 Other chronic pain: Secondary | ICD-10-CM | POA: Insufficient documentation

## 2017-08-26 MED ORDER — PREDNISONE 20 MG PO TABS: 60.0000 mg | ORAL_TABLET | Freq: Every day | ORAL | 0 refills | Status: AC

## 2017-08-26 MED ORDER — HYDROCODONE-ACETAMINOPHEN 5-325 MG PO TABS
1.0000 | ORAL_TABLET | Freq: Once | ORAL | Status: AC
  Administered 2017-08-26: 1 via ORAL
  Filled 2017-08-26: qty 1

## 2017-08-26 MED ORDER — PREDNISONE 20 MG PO TABS
60.0000 mg | ORAL_TABLET | Freq: Once | ORAL | Status: AC
  Administered 2017-08-26: 60 mg via ORAL
  Filled 2017-08-26: qty 3

## 2017-08-26 NOTE — ED Notes (Signed)
Brooke - RN informed of pt's BP.

## 2017-08-26 NOTE — ED Triage Notes (Signed)
Patient complains of lower back pain that he describes as chronic and this am developed increased pain, denies trauma. NAD

## 2017-08-26 NOTE — Discharge Instructions (Signed)
You presented to ED for worsening chronic back pain.    Please take prednisone as prescribed. Go to pharmacy and refill your pain medications. Additionally, you can take 500 mg tylenol every 8 hours. Heating pad and light massage may also help.

## 2017-08-26 NOTE — ED Provider Notes (Signed)
[facility] EMERGENCY DEPARTMENT Provider Note   CSN: [id number] Arrival date & time: 08/26/17  1014     History   Chief Complaint No chief complaint on file.   HPI Shane Oliver is a 56 y.o. male w/ h/o chronic back pain from childhood accident presents to ED for evaluation of acute on chronic lumbar back pain onset this morning when getting out of bed. States pain is typical to chronic low back pain but more intense, starts in lumbar back and shoots down bilateral legs worse on the L. Worse with movement, palpation and bending forward.  Take hydrycodone for pain, last dose last night. Ran out of hydrocodone today. Has refill ready for him at pharmacy as soon as tomorrow. Denies falls, trauma, fevers, chills, b/b incontinence or retention, abdominal pain, dysuria, hematuria, rashes, numbness or weakness to legs. No h/o DM or IVDU. Pt states he had a brief episode of clamminess and "hot flashes" while waiting to be seen, went to bathroom and had a big BM. Hot flashes now improving.   HPI  Past Medical History:  Diagnosis Date  . Asthma   . Back pain, chronic   . Chronic back pain 12/26/2012  . Hyperlipidemia LDL goal < 100 12/26/2012    Patient Active Problem List   Diagnosis Date Noted  . Prediabetes 12/26/2012  . Elevated LDL cholesterol level 12/26/2012  . Hyperlipidemia LDL goal < 100 12/26/2012  . Chronic back pain 12/26/2012    History reviewed. No pertinent surgical history.     Home Medications    Prior to Admission medications   Medication Sig Start Date End Date Taking? Authorizing Provider  albuterol (PROVENTIL HFA;VENTOLIN HFA) 108 (90 BASE) MCG/ACT inhaler Inhale 2 puffs into the lungs every 6 (six) hours as needed. For shortness of breath   Yes [provider]  HYDROcodone-acetaminophen (NORCO/VICODIN) 5-325 MG per tablet Take 1-2 tablets by mouth every 6 hours as needed for pain. 05/28/14  Yes Pisciotta, Joni Reining, PA-C    ibuprofen (ADVIL,MOTRIN) 200 MG tablet Take 400 mg by mouth every 6 (six) hours as needed for moderate pain.   Yes [provider]  cholecalciferol (VITAMIN D) 1000 UNITS tablet Take 1,000 Units by mouth daily.    [provider]  EPINEPHrine (EPIPEN) 0.3 mg/0.3 mL IJ SOAJ injection Inject 0.3 mg into the muscle once.    [provider]  nabumetone (RELAFEN) 750 MG tablet Take 1 tablet (750 mg total) by mouth 2 (two) times daily as needed for pain. Patient not taking: Reported on 08/26/2017 12/25/12   Cleora Fleet, MD  predniSONE (DELTASONE) 20 MG tablet Take 3 tablets (60 mg total) by mouth daily. Take 60 mg by mouth daily week 1, then 40mg  by mouth daily for week 2, then 20mg  daily for week 3 08/26/17 08/31/17  Liberty Handy, PA-C    Family History No family history on file.  Social History Social History  Substance Use Topics  . Smoking status: Never Smoker  . Smokeless tobacco: Never Used  . Alcohol use No     Allergies   Other   Review of Systems Review of Systems  Constitutional: Negative for chills and fever.  Gastrointestinal: Negative for abdominal pain, constipation, diarrhea, nausea and vomiting.  Genitourinary: Negative for difficulty urinating.  Musculoskeletal: Positive for back pain and myalgias. Negative for neck pain and neck stiffness.  Skin: Negative for rash.  Allergic/Immunologic: Negative for immunocompromised state.  Neurological: Negative for weakness and numbness.  Physical Exam Updated Vital Signs BP 100/75 (BP Location: Right Arm)   Pulse 85   Temp 98 F (36.7 C) (Oral)   Resp 16   SpO2 100%   Physical Exam  Constitutional: He appears well-developed and well-nourished. No distress.  HENT:  Head: Normocephalic and atraumatic.  Nose: Nose normal.  Eyes: EOM are normal.  Neck:  No midline cervical spine tenderness No cervical paraspinal muscular tenderness or increased tone Full AROM of cervical  spine without pain or rigidity   Cardiovascular: Normal rate, S1 normal, S2 normal and normal heart sounds.   Pulses:      Radial pulses are 2+ on the right side, and 2+ on the left side.       Dorsalis pedis pulses are 2+ on the right side, and 2+ on the left side.  Pulmonary/Chest: Effort normal and breath sounds normal. He has no decreased breath sounds. He exhibits no tenderness.  Abdominal: Soft. Normal appearance and bowel sounds are normal. There is no tenderness.  No suprapubic or CVA tenderness   Musculoskeletal: He exhibits tenderness.       Lumbar back: He exhibits tenderness and pain.  No CTL spine midline tenderness +L spine paraspinal muscular tenderness L>R  Full AROM of T/L spine, pain with hip flexion L>R +L SI joint and sciatic notch tender +Positive SLR, bilateral Negative Pearlean Brownie.  Negative Stinchfield test.   Neurological:  5/5 strength with flexion/extension of hip, knee and ankle, bilaterally.  Sensation to light touch intact in lower extremities including feet  Skin: Skin is warm and dry. Capillary refill takes less than 2 seconds.  Psychiatric: He has a normal mood and affect. His behavior is normal. Judgment and thought content normal.     ED Treatments / Results  Labs (all labs ordered are listed, but only abnormal results are displayed) Labs Reviewed - No data to display  EKG  EKG Interpretation None       Radiology No results found.  Procedures Procedures (including critical care time)  Medications Ordered in ED Medications  HYDROcodone-acetaminophen (NORCO/VICODIN) 5-325 MG per tablet 1 tablet (1 tablet Oral Given 08/26/17 1302)  predniSONE (DELTASONE) tablet 60 mg (60 mg Oral Given 08/26/17 1301)     Initial Impression / Assessment and Plan / ED Course  I have reviewed the triage vital signs and the nursing notes.  Pertinent labs & imaging results that were available during my care of the patient were reviewed by me and considered in  my medical decision making (see chart for details).    Patient is a 56 y.o. male with a hx of chronic back pain presents for acute on chronic back pain. No falls or trauma.   On exam pt has VSS, abdominal exam reassuring without suprapubic or CVAT. Distal pulses symmetric bilaterally.  No focal neurological deficits appreciated. Patient is ambulatory. Considered ruptured disc, UTI/pyelo, PID, kidney stone, cauda equina or epidural abscess however these don't fit clinical picture. Most likely chronic DDD vs sciatica vs acute exacerbation of chronic LB injury.  No red flag symptoms of back pain including: bladder/bowel incontinence or retention, night sweats, night pain, fevers or weight loss, h/o cancer, IVDU, recent trauma or falls. Lab work or imaging not indicated today. Will give prednisone, encourage NSAIDs. He has rx for hydrocodone to pick up tomorrow from pharmacy, will give one dose here. PCP follow-up if no improvement with conservative management. ED return precautions discussed with patient who verbalized understanding and is agreeable to plan.  Pt had brief episode of what sounds like vasovagal symptoms before/during BM in ED. His BP dropped to 90 SBP. He has no other complaints and symptoms have resolved. Has not had anything to drink today so suspect vasovagal vs orthostatic. No CP, SOB, dizziness. Orally hydrated and gave food, EKG normal. Adequate for d/c. He is agreeable with d/c. Discussed s/s that would warrant return to ED. Patient, ED treatment and discharge plan was discussed with supervising physician who is agreeable with plan.   Final Clinical Impressions(s) / ED Diagnoses   Final diagnoses:  Chronic bilateral low back pain with left-sided sciatica    New Prescriptions Discharge Medication List as of 08/26/2017  2:18 PM       Liberty HandyGibbons, Ardis Lawley J, PA-C 08/26/17 1507    Pricilla LovelessGoldston, Scott, MD 08/26/17 1710

## 2017-08-26 NOTE — ED Notes (Signed)
Reassessed pt after he reported to EMT that he was having hot flashes. Pt alert and oriented, does appear slightly pale in color but states he just went to the restroom and had a bowel movement. Skin warm and dry. Rechecked BP which is now 92/73.

## 2017-08-26 NOTE — ED Notes (Signed)
Pt's family member stated that pt was "having hot flashes". Pt's vital signs were reassessed and Nehemiah SettleBrooke - RN was informed.

## 2018-03-16 ENCOUNTER — Emergency Department (HOSPITAL_COMMUNITY)
Admission: EM | Admit: 2018-03-16 | Discharge: 2018-03-16 | Disposition: A | Discharge status: Home / Self Care | Payer: Medicaid Other | Attending: Emergency Medicine | Admitting: Emergency Medicine

## 2018-03-16 ENCOUNTER — Other Ambulatory Visit: Payer: Self-pay

## 2018-03-16 ENCOUNTER — Encounter (HOSPITAL_COMMUNITY): Payer: Self-pay

## 2018-03-16 DIAGNOSIS — M5442 Lumbago with sciatica, left side: Secondary | ICD-10-CM | POA: Diagnosis not present

## 2018-03-16 DIAGNOSIS — M549 Dorsalgia, unspecified: Secondary | ICD-10-CM | POA: Diagnosis present

## 2018-03-16 DIAGNOSIS — Z79899 Other long term (current) drug therapy: Secondary | ICD-10-CM | POA: Diagnosis not present

## 2018-03-16 DIAGNOSIS — J45909 Unspecified asthma, uncomplicated: Secondary | ICD-10-CM | POA: Diagnosis not present

## 2018-03-16 DIAGNOSIS — G8929 Other chronic pain: Secondary | ICD-10-CM

## 2018-03-16 MED ORDER — METHYLPREDNISOLONE 4 MG PO TBPK: ORAL_TABLET | ORAL | 0 refills | Status: DC

## 2018-03-16 MED ORDER — METHOCARBAMOL 500 MG PO TABS: 500.0000 mg | ORAL_TABLET | Freq: Two times a day (BID) | ORAL | 0 refills | Status: DC

## 2018-03-16 MED ORDER — HYDROCODONE-ACETAMINOPHEN 5-325 MG PO TABS
1.0000 | ORAL_TABLET | Freq: Once | ORAL | Status: AC
  Administered 2018-03-16: 1 via ORAL
  Filled 2018-03-16: qty 1

## 2018-03-16 MED ORDER — KETOROLAC TROMETHAMINE 15 MG/ML IJ SOLN
15.0000 mg | Freq: Once | INTRAMUSCULAR | Status: AC
  Administered 2018-03-16: 15 mg via INTRAMUSCULAR
  Filled 2018-03-16: qty 1

## 2018-03-16 MED ORDER — DEXAMETHASONE SODIUM PHOSPHATE 10 MG/ML IJ SOLN
10.0000 mg | Freq: Once | INTRAMUSCULAR | Status: AC
  Administered 2018-03-16: 10 mg via INTRAMUSCULAR
  Filled 2018-03-16: qty 1

## 2018-03-16 MED ORDER — METHOCARBAMOL 500 MG PO TABS
1000.0000 mg | ORAL_TABLET | Freq: Once | ORAL | Status: AC
  Administered 2018-03-16: 1000 mg via ORAL
  Filled 2018-03-16: qty 2

## 2018-03-16 NOTE — Discharge Instructions (Signed)
Please use steroid Dosepak as directed, as well as muscle relaxers, do not combine muscle relaxer with alcohol, do not take before driving or operating any machinery, as this medication can make you drowsy.  You may also use ibuprofen and Tylenol, encourage you to try topical pain medications as well such as Biofreeze or salonpas patches.  Try and rest and avoid forward bending or heavy lifting, given that this is an exacerbation of your chronic back pain you will need to follow-up with your primary care doctor, and/or orthopedist for continued management.  Return to the emergency department if you have weakness or numbness in both her legs, or unable to walk, lose control of your bowels or bladder, develop urinary symptoms, abdominal pain, fevers or any other new or concerning symptoms.

## 2018-03-16 NOTE — ED Triage Notes (Signed)
Pt c/o chronic back with increasing pain for the past 1-2 weeks. Pt rpeorts he recently started working again. Pt was in pain management but is no longer r/t cost.

## 2018-03-16 NOTE — ED Provider Notes (Signed)
[facility] EMERGENCY DEPARTMENT Provider Note   CSN: [id number] Arrival date & time: 03/16/18  4098     History   Chief Complaint Chief Complaint  Patient presents with  . Back Pain    HPI Shane Oliver is a 57 y.o. male.  Shane Oliver is a 57 y.o. Male with a history of chronic back pain from a childhood injury, asthma, and hyperlipidemia, who presents to the emergency department for evaluation of acute on chronic back pain.  Patient reports about 2 weeks ago he had to start working again after being denied disability.  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  He reports this is very typical of his chronic back pain.  Patient denies any falls or injury just that he has been more physically active with work recently.  Patient reports he has been taking ibuprofen intermittently but has not tried anything else for pain.  Patient reports he used to be seen at a pain management clinic but has been unable to follow-up due to cost, has not followed up with his primary care doctor for his orthopedist.  He reports some occasional tingling in the left leg, but denies any weakness or numbness just primarily pain with movement.  Patient has been ambulatory although it is painful.  Denies any loss of bowel or bladder control.  No fevers, abdominal pain or urinary symptoms.  Patient denies any history of cancer or IV drug use.     Past Medical History:  Diagnosis Date  . Asthma   . Back pain, chronic   . Chronic back pain 12/26/2012  . Hyperlipidemia LDL goal < 100 12/26/2012    Patient Active Problem List   Diagnosis Date Noted  . Prediabetes 12/26/2012  . Elevated LDL cholesterol level 12/26/2012  . Hyperlipidemia LDL goal < 100 12/26/2012  . Chronic back pain 12/26/2012    History reviewed. No  pertinent surgical history.      Home Medications    Prior to Admission medications   Medication Sig Start Date End Date Taking? Authorizing Provider  albuterol (PROVENTIL HFA;VENTOLIN HFA) 108 (90 BASE) MCG/ACT inhaler Inhale 2 puffs into the lungs every 6 (six) hours as needed. For shortness of breath    [provider]  cholecalciferol (VITAMIN D) 1000 UNITS tablet Take 1,000 Units by mouth daily.    [provider]  EPINEPHrine (EPIPEN) 0.3 mg/0.3 mL IJ SOAJ injection Inject 0.3 mg into the muscle once.    [provider]  HYDROcodone-acetaminophen (NORCO/VICODIN) 5-325 MG per tablet Take 1-2 tablets by mouth every 6 hours as needed for pain. 05/28/14   Pisciotta, Joni Reining, PA-C  ibuprofen (ADVIL,MOTRIN) 200 MG tablet Take 400 mg by mouth every 6 (six) hours as needed for moderate pain.    [provider]  nabumetone (RELAFEN) 750 MG tablet Take 1 tablet (750 mg total) by mouth 2 (two) times daily as needed for pain. Patient not taking: Reported on 08/26/2017 12/25/12   Cleora Fleet, MD    Family History No family history on file.  Social History Social History   Tobacco Use  . Smoking status: Never Smoker  . Smokeless tobacco: Never Used  Substance Use Topics  . Alcohol use: No  . Drug use: No     Allergies   Other   Review  of Systems Review of Systems  Constitutional: Negative for chills and fever.  Respiratory: Negative for shortness of breath.   Cardiovascular: Negative for chest pain.  Gastrointestinal: Negative for abdominal pain, blood in stool, constipation, diarrhea, nausea and vomiting.  Genitourinary: Negative for difficulty urinating, dysuria, flank pain, frequency and hematuria.  Musculoskeletal: Positive for back pain. Negative for arthralgias, gait problem, joint swelling, myalgias, neck pain and neck stiffness.  Skin: Negative for color change and rash.  Neurological: Negative for dizziness, syncope, weakness,  light-headedness and numbness.     Physical Exam Updated Vital Signs BP 125/83 (BP Location: Left Arm)   Pulse 79   Temp 99 F (37.2 C) (Oral)   Resp 16   Ht  (1.702 m)   Wt 83.5 kg (184 lb)   SpO2 100%   BMI 28.82 kg/m   Physical Exam  Constitutional: He appears well-developed and well-nourished. No distress.  HENT:  Head: Normocephalic and atraumatic.  Eyes: Right eye exhibits no discharge. Left eye exhibits no discharge.  Pulmonary/Chest: Effort normal. No respiratory distress.  Abdominal: Soft. Bowel sounds are normal. He exhibits no distension and no mass. There is no tenderness. There is no guarding.  Abdomen soft, nondistended, bowel sounds present throughout, no palpable or pulsatile masses, nontender to palpation in all quadrants, no CVA tenderness  Musculoskeletal:  Tenderness to profile patient primarily over the left lower back, no overlying erythema, warmth or appreciable deformity.  Positive straight leg raise on the left, negative on the right.  Pain is worse with movement of the lower extremities, but full range of motion intact, patient is able to stand up and transfer to the bathroom without assistance with some discomfort  Neurological: He is alert. Coordination normal.  Speech is clear, able to follow commands CN III-XII intact Normal strength in upper and lower extremities bilaterally including dorsiflexion and plantar flexion, strong and equal grip strength 2+ DTRs in bilateral lower extremities Sensation normal to light and sharp touch Moves extremities without ataxia, coordination intact Normal finger to nose and rapid alternating movements No pronator drift  Skin: Skin is warm and dry. Capillary refill takes less than 2 seconds. He is not diaphoretic.  Psychiatric: He has a normal mood and affect. His behavior is normal.  Nursing note and vitals reviewed.    ED Treatments / Results  Labs (all labs ordered are listed, but only abnormal results  are displayed) Labs Reviewed - No data to display  EKG None  Radiology No results found.  Procedures Procedures (including critical care time)  Medications Ordered in ED Medications  ketorolac (TORADOL) 15 MG/ML injection 15 mg (has no administration in time range)  dexamethasone (DECADRON) injection 10 mg (has no administration in time range)  HYDROcodone-acetaminophen (NORCO/VICODIN) 5-325 MG per tablet 1 tablet (has no administration in time range)  methocarbamol (ROBAXIN) tablet 1,000 mg (has no administration in time range)     Initial Impression / Assessment and Plan / ED Course  I have reviewed the triage vital signs and the nursing notes.  Pertinent labs & imaging results that were available during my care of the patient were reviewed by me and considered in my medical decision making (see chart for details).  Patient with acute exacerbation of his chronic back pain after returning to work with more physical activity than usual.  No neurological deficits and normal neuro exam.  Patient able to walk with some discomfort here in the emergency department..  No loss of bowel or bladder control.  No concern for cauda equina.  No fever, night sweats, weight loss, h/o cancer, IVDU.  Pain treated in the emergency department with Toradol, steroids, Norco and Robaxin, will discharge with Medrol Dosepak and Robaxin.  Patient will need to follow-up with his primary care doctor and orthopedist for continued management.  Return precautions discussed.  Patient expressed understanding and is in agreement with plan.  Final Clinical Impressions(s) / ED Diagnoses   Final diagnoses:  Chronic bilateral low back pain with left-sided sciatica    ED Discharge Orders        Ordered    methylPREDNISolone (MEDROL DOSEPAK) 4 MG TBPK tablet     03/16/18 1406    methocarbamol (ROBAXIN) 500 MG tablet  2 times daily     03/16/18 1406       Dartha Lodge, [place] 03/16/18 1733    Gerhard Munch, MD 03/18/18 1850

## 2019-01-06 ENCOUNTER — Ambulatory Visit (INDEPENDENT_AMBULATORY_CARE_PROVIDER_SITE_OTHER): Payer: Medicaid Other

## 2019-01-06 ENCOUNTER — Encounter (INDEPENDENT_AMBULATORY_CARE_PROVIDER_SITE_OTHER): Payer: Self-pay | Admitting: Orthopedic Surgery

## 2019-01-06 ENCOUNTER — Ambulatory Visit (INDEPENDENT_AMBULATORY_CARE_PROVIDER_SITE_OTHER): Payer: Medicaid Other | Admitting: Orthopedic Surgery

## 2019-01-06 DIAGNOSIS — M79601 Pain in right arm: Secondary | ICD-10-CM | POA: Diagnosis not present

## 2019-01-06 DIAGNOSIS — M542 Cervicalgia: Secondary | ICD-10-CM

## 2019-01-07 NOTE — Progress Notes (Signed)
Office Visit Note   Patient: Shane Oliver           Date of Birth: 1961/06/08           MRN: [id number] Visit Date: 01/06/2019 Requested by: Norm Salt, PA [address] [place], [place] [address] PCP: System, Pcp Not In  Subjective: Chief Complaint  Patient presents with  . Right Shoulder - Pain    HPI: Shane Oliver is a patient with 3-year history of right shoulder and arm pain.  Is become worse over the past several months.  He is right-hand dominant.  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  Unable to lay on that right-hand side.  He reports neck pain as well as numbness and tingling which reaches all the way down to his fingertips.  He is trying to get disability for his back.  Denies any frank weakness in that right arm.              ROS: All systems reviewed are negative as they relate to the chief complaint within the history of present illness.  Patient denies  fevers or chills.   Assessment & Plan: Visit Diagnoses:  1. Pain of right upper extremity   2. Cervicalgia     Plan: Impression is right shoulder neck pain with radicular symptoms.  Shoulder x-rays and exam are pretty normal.  I would say that this looks more like radiculopathy with the symptom excursion below the elbow.  Also has numbness and tingling.  Because of the duration of symptoms he is also failed treatment with activity modification and anti-inflammatories and Tylenol.  I would favor MRI scan with epidural steroid injections.  Does not look like this is a surgical problem but he has exclusively right-sided symptoms consistent with radiculopathy.  Follow-Up Instructions: Return for after MRI.   Orders:  Orders Placed This Encounter  Procedures  . XR Cervical Spine 2 or 3 views  . XR Shoulder Right  . MR Cervical Spine w/o contrast   No orders of the defined types were placed in this encounter.     Procedures: No procedures performed   Clinical Data: No additional  findings.  Objective: Vital Signs: There were no vitals taken for this visit.  Physical Exam:   Constitutional: Patient appears well-developed HEENT:  Head: Normocephalic Eyes:EOM are normal Neck: Normal range of motion Cardiovascular: Normal rate Pulmonary/chest: Effort normal Neurologic: Patient is alert Skin: Skin is warm Psychiatric: Patient has normal mood and affect    Ortho Exam: Ortho exam demonstrates good cervical spine range of motion with 5 out of 5 grip EPL FPL interosseous wrist flexion extension bicep triceps and deltoid strength.  Radial pulse intact.  No paresthesias C5 T1.  Shoulder exam demonstrates full active and passive range of motion of the shoulder with negative apprehension relocation testing.  Has excellent rotator cuff strength isolated infraspinatus supraspinatus and subscap muscle testing.  No other masses lymphadenopathy or skin changes noted in that right shoulder girdle region.  Specialty Comments:  No specialty comments available.  Imaging: No results found.   PMFS History: Patient Active Problem List   Diagnosis Date Noted  . Prediabetes 12/26/2012  . Elevated LDL cholesterol level 12/26/2012  . Hyperlipidemia LDL goal < 100 12/26/2012  . Chronic back pain 12/26/2012   Past Medical History:  Diagnosis Date  . Asthma   . Back pain, chronic   . Chronic back pain 12/26/2012  . Hyperlipidemia LDL goal < 100  12/26/2012    History reviewed. No pertinent family history.  History reviewed. No pertinent surgical history. Social History   Occupational History  . Not on file  Tobacco Use  . Smoking status: Never Smoker  . Smokeless tobacco: Never Used  Substance and Sexual Activity  . Alcohol use: No  . Drug use: No  . Sexual activity: Yes

## 2019-01-20 ENCOUNTER — Other Ambulatory Visit: Payer: Self-pay

## 2019-01-20 ENCOUNTER — Emergency Department (HOSPITAL_COMMUNITY)
Admission: EM | Admit: 2019-01-20 | Discharge: 2019-01-20 | Disposition: A | Discharge status: Home / Self Care | Payer: Medicaid Other | Attending: Emergency Medicine | Admitting: Emergency Medicine

## 2019-01-20 ENCOUNTER — Emergency Department (HOSPITAL_COMMUNITY): Payer: Medicaid Other

## 2019-01-20 ENCOUNTER — Encounter (HOSPITAL_COMMUNITY): Payer: Self-pay

## 2019-01-20 DIAGNOSIS — W182XXA Fall in (into) shower or empty bathtub, initial encounter: Secondary | ICD-10-CM | POA: Diagnosis not present

## 2019-01-20 DIAGNOSIS — S39012A Strain of muscle, fascia and tendon of lower back, initial encounter: Secondary | ICD-10-CM | POA: Diagnosis not present

## 2019-01-20 DIAGNOSIS — Y92002 Bathroom of unspecified non-institutional (private) residence single-family (private) house as the place of occurrence of the external cause: Secondary | ICD-10-CM | POA: Insufficient documentation

## 2019-01-20 DIAGNOSIS — Y93E8 Activity, other personal hygiene: Secondary | ICD-10-CM | POA: Insufficient documentation

## 2019-01-20 DIAGNOSIS — S335XXA Sprain of ligaments of lumbar spine, initial encounter: Secondary | ICD-10-CM

## 2019-01-20 DIAGNOSIS — Y999 Unspecified external cause status: Secondary | ICD-10-CM | POA: Insufficient documentation

## 2019-01-20 DIAGNOSIS — S6391XA Sprain of unspecified part of right wrist and hand, initial encounter: Secondary | ICD-10-CM | POA: Diagnosis not present

## 2019-01-20 DIAGNOSIS — S6991XA Unspecified injury of right wrist, hand and finger(s), initial encounter: Secondary | ICD-10-CM | POA: Diagnosis present

## 2019-01-20 DIAGNOSIS — Z79899 Other long term (current) drug therapy: Secondary | ICD-10-CM | POA: Diagnosis not present

## 2019-01-20 DIAGNOSIS — W19XXXA Unspecified fall, initial encounter: Secondary | ICD-10-CM

## 2019-01-20 MED ORDER — HYDROCODONE-ACETAMINOPHEN 5-325 MG PO TABS
1.0000 | ORAL_TABLET | Freq: Once | ORAL | Status: AC
  Administered 2019-01-20: 1 via ORAL
  Filled 2019-01-20: qty 1

## 2019-01-20 MED ORDER — CYCLOBENZAPRINE HCL 10 MG PO TABS: 10.0000 mg | ORAL_TABLET | Freq: Two times a day (BID) | ORAL | 0 refills | Status: DC | PRN

## 2019-01-20 MED ORDER — CYCLOBENZAPRINE HCL 10 MG PO TABS
10.0000 mg | ORAL_TABLET | Freq: Once | ORAL | Status: AC
  Administered 2019-01-20: 10 mg via ORAL
  Filled 2019-01-20: qty 1

## 2019-01-20 NOTE — ED Notes (Signed)
Pt refused discharge vital signs

## 2019-01-20 NOTE — ED Triage Notes (Addendum)
Patient states he fell getting out of a tub due to it being slippery. Patient states he grabbed the shower curtain, but hit his right hand on the side of the tub. Patient c/o left lower back pain that radiates into the left shoulder and left leg. Patient denies hitting his head or having LOC.

## 2019-01-20 NOTE — ED Notes (Signed)
Patient transported to X-ray 

## 2019-01-20 NOTE — ED Provider Notes (Signed)
[facility]-EMERGENCY DEPT Provider Note   CSN: [id number] Arrival date & time: 01/20/19  1633    History   Chief Complaint Chief Complaint  Patient presents with  . Fall  . Hand Pain  . Back Pain    HPI Shane Oliver is a 58 y.o. male who presents to the ED s/p fall with c/o back and hand pain. Patient reports he slipped and almost fell in the tub when getting out. He states he grabbed the curtain and hit his hand on the edge of the tub and twisted his lower back. Patient denies head injury or LOC. He denies any other injuries.  No loss of control of bladder or bowels.     HPI  Past Medical History:  Diagnosis Date  . Asthma   . Back pain, chronic   . Chronic back pain 12/26/2012  . Hyperlipidemia LDL goal < 100 12/26/2012    Patient Active Problem List   Diagnosis Date Noted  . Prediabetes 12/26/2012  . Elevated LDL cholesterol level 12/26/2012  . Hyperlipidemia LDL goal < 100 12/26/2012  . Chronic back pain 12/26/2012    History reviewed. No pertinent surgical history.      Home Medications    Prior to Admission medications   Medication Sig Start Date End Date Taking? Authorizing Provider  albuterol (PROVENTIL HFA;VENTOLIN HFA) 108 (90 BASE) MCG/ACT inhaler Inhale 2 puffs into the lungs every 6 (six) hours as needed. For shortness of breath    [provider]  cholecalciferol (VITAMIN D) 1000 UNITS tablet Take 1,000 Units by mouth daily.    [provider]  cyclobenzaprine (FLEXERIL) 10 MG tablet Take 1 tablet (10 mg total) by mouth 2 (two) times daily as needed for muscle spasms. 01/20/19   Janne Napoleon, NP  EPINEPHrine (EPIPEN) 0.3 mg/0.3 mL IJ SOAJ injection Inject 0.3 mg into the muscle once.    [provider]  HYDROcodone-acetaminophen (NORCO/VICODIN) 5-325 MG per tablet Take 1-2 tablets by mouth every 6 hours as needed for pain. 05/28/14   Pisciotta, Joni Reining, PA-C  ibuprofen (ADVIL,MOTRIN) 200 MG tablet  Take 400 mg by mouth every 6 (six) hours as needed for moderate pain.    [provider]  methocarbamol (ROBAXIN) 500 MG tablet Take 1 tablet (500 mg total) by mouth 2 (two) times daily. 03/16/18   Dartha Lodge, PA-C  methylPREDNISolone (MEDROL DOSEPAK) 4 MG TBPK tablet Take as directed 03/16/18   Dartha Lodge, PA-C  nabumetone (RELAFEN) 750 MG tablet Take 1 tablet (750 mg total) by mouth 2 (two) times daily as needed for pain. Patient not taking: Reported on 08/26/2017 12/25/12   Cleora Fleet, MD    Family History Family History  Problem Relation Age of Onset  . Diabetes Mother   . Stroke Father   . Diabetes Father     Social History Social History   Tobacco Use  . Smoking status: Never Smoker  . Smokeless tobacco: Never Used  Substance Use Topics  . Alcohol use: No  . Drug use: No     Allergies   Other   Review of Systems Review of Systems  Musculoskeletal: Positive for arthralgias and back pain.  All other systems reviewed and are negative.    Physical Exam Updated Vital Signs BP 128/85 (BP Location: Left Arm)   Pulse 82   Temp 98.3 F (36.8 C) (Oral)   Resp 17   Ht  (1.702 m)   Wt 83.5 kg  SpO2 99%   BMI 28.82 kg/m   Physical Exam Vitals signs and nursing note reviewed.  Constitutional:      General: He is not in acute distress.    Appearance: He is well-developed.  HENT:     Head: Normocephalic and atraumatic.     Mouth/Throat:     Mouth: Mucous membranes are moist.  Eyes:     Extraocular Movements: Extraocular movements intact.     Conjunctiva/sclera: Conjunctivae normal.  Neck:     Musculoskeletal: Neck supple. No muscular tenderness.  Cardiovascular:     Rate and Rhythm: Normal rate.     Pulses: Normal pulses.  Pulmonary:     Effort: Pulmonary effort is normal.  Musculoskeletal:     Lumbar back: He exhibits tenderness, pain and spasm. He exhibits no swelling, no deformity and normal pulse. Decreased range of motion:  due to pain.       Back:  Skin:    General: Skin is warm and dry.  Neurological:     Mental Status: He is alert and oriented to person, place, and time.     Sensory: Sensation is intact.     Motor: No weakness.     Gait: Gait normal.     Deep Tendon Reflexes: Reflexes are normal and symmetric.      ED Treatments / Results  Labs (all labs ordered are listed, but only abnormal results are displayed) Labs Reviewed - No data to display  Radiology Dg Lumbar Spine Complete  Result Date: 01/20/2019 CLINICAL DATA:  Left low back pain after falling getting out of the bathtub today. EXAM: LUMBAR SPINE - COMPLETE 4+ VIEW COMPARISON:  Lumbar spine MR dated 01/17/2015. FINDINGS: Five non-rib-bearing lumbar vertebrae. Minimal anterior spur formation at the L1-2 and L4-5 levels and minimal lateral spur formation at the L3-4 level. No fractures, pars defects or subluxations. IMPRESSION: No fracture or subluxation. Minimal degenerative changes. Electronically Signed   By: Beckie Salts M.D.   On: 01/20/2019 19:05   Dg Hand Complete Right  Result Date: 01/20/2019 CLINICAL DATA:  Hand injury with pain EXAM: RIGHT HAND - COMPLETE 3+ VIEW COMPARISON:  None. FINDINGS: There is no evidence of fracture or dislocation. There is no evidence of arthropathy or other focal bone abnormality. Soft tissues are unremarkable. IMPRESSION: Negative. Electronically Signed   By: Jasmine Pang M.D.   On: 01/20/2019 17:48    Procedures Procedures (including critical care time)  Medications Ordered in ED Medications  cyclobenzaprine (FLEXERIL) tablet 10 mg (10 mg Oral Given 01/20/19 1840)  HYDROcodone-acetaminophen (NORCO/VICODIN) 5-325 MG per tablet 1 tablet (1 tablet Oral Given 01/20/19 1840)     Initial Impression / Assessment and Plan / ED Course  I have reviewed the triage vital signs and the nursing notes. 58 y.o. male here with right hand and low back pain s/p near fall stable for d/c without acute findings on  xray. Patient will be prescribed muscle relaxer and he will take his ibuprofen. F/u with PCP. Return precautions discussed.  Final Clinical Impressions(s) / ED Diagnoses   Final diagnoses:  Fall, initial encounter  Sprain of right hand, initial encounter  Lumbar sprain, initial encounter    ED Discharge Orders         Ordered    cyclobenzaprine (FLEXERIL) 10 MG tablet  2 times daily PRN     01/20/19 1923           Kerrie [place], NP 01/20/19 1926    Charlynne Pander,  MD 01/20/19 2218

## 2019-01-20 NOTE — Discharge Instructions (Addendum)
Take your ibuprofen as directed. Do not take the muscle relaxer if driving as it will make you sleepy. Return as needed for worsening symptoms.

## 2019-01-25 ENCOUNTER — Inpatient Hospital Stay: Admission: RE | Admit: 2019-01-25 | Payer: Medicaid Other | Source: Ambulatory Visit

## 2019-02-02 ENCOUNTER — Other Ambulatory Visit: Payer: Medicaid Other

## 2019-03-25 ENCOUNTER — Ambulatory Visit
Admission: RE | Admit: 2019-03-25 | Discharge: 2019-03-25 | Disposition: A | Discharge status: Home / Self Care | Payer: Medicaid Other | Source: Ambulatory Visit | Attending: Orthopedic Surgery | Admitting: Orthopedic Surgery

## 2019-03-25 ENCOUNTER — Other Ambulatory Visit: Payer: Self-pay

## 2019-03-25 DIAGNOSIS — M542 Cervicalgia: Secondary | ICD-10-CM

## 2019-12-14 ENCOUNTER — Other Ambulatory Visit: Payer: Self-pay

## 2019-12-14 ENCOUNTER — Inpatient Hospital Stay (HOSPITAL_COMMUNITY)
Admission: EM | Admit: 2019-12-14 | Discharge: 2019-12-16 | DRG: 176 | Disposition: A | Discharge status: Home / Self Care | Payer: Medicaid Other | Attending: Internal Medicine | Admitting: Internal Medicine

## 2019-12-14 ENCOUNTER — Encounter (HOSPITAL_COMMUNITY): Payer: Self-pay | Admitting: Emergency Medicine

## 2019-12-14 ENCOUNTER — Emergency Department (HOSPITAL_COMMUNITY): Payer: Medicaid Other

## 2019-12-14 DIAGNOSIS — Z20822 Contact with and (suspected) exposure to covid-19: Secondary | ICD-10-CM | POA: Diagnosis present

## 2019-12-14 DIAGNOSIS — M545 Low back pain: Secondary | ICD-10-CM | POA: Diagnosis present

## 2019-12-14 DIAGNOSIS — F329 Major depressive disorder, single episode, unspecified: Secondary | ICD-10-CM | POA: Diagnosis present

## 2019-12-14 DIAGNOSIS — Z79899 Other long term (current) drug therapy: Secondary | ICD-10-CM | POA: Diagnosis not present

## 2019-12-14 DIAGNOSIS — Z823 Family history of stroke: Secondary | ICD-10-CM | POA: Diagnosis not present

## 2019-12-14 DIAGNOSIS — Z833 Family history of diabetes mellitus: Secondary | ICD-10-CM | POA: Diagnosis not present

## 2019-12-14 DIAGNOSIS — E785 Hyperlipidemia, unspecified: Secondary | ICD-10-CM | POA: Diagnosis present

## 2019-12-14 DIAGNOSIS — I2609 Other pulmonary embolism with acute cor pulmonale: Secondary | ICD-10-CM

## 2019-12-14 DIAGNOSIS — M5440 Lumbago with sciatica, unspecified side: Secondary | ICD-10-CM | POA: Diagnosis not present

## 2019-12-14 DIAGNOSIS — M549 Dorsalgia, unspecified: Secondary | ICD-10-CM | POA: Diagnosis present

## 2019-12-14 DIAGNOSIS — Z79891 Long term (current) use of opiate analgesic: Secondary | ICD-10-CM | POA: Diagnosis not present

## 2019-12-14 DIAGNOSIS — J45909 Unspecified asthma, uncomplicated: Secondary | ICD-10-CM | POA: Diagnosis present

## 2019-12-14 DIAGNOSIS — G8929 Other chronic pain: Secondary | ICD-10-CM | POA: Diagnosis present

## 2019-12-14 DIAGNOSIS — E876 Hypokalemia: Secondary | ICD-10-CM | POA: Diagnosis present

## 2019-12-14 DIAGNOSIS — F419 Anxiety disorder, unspecified: Secondary | ICD-10-CM | POA: Diagnosis present

## 2019-12-14 DIAGNOSIS — I2699 Other pulmonary embolism without acute cor pulmonale: Principal | ICD-10-CM | POA: Diagnosis present

## 2019-12-14 DIAGNOSIS — R0602 Shortness of breath: Secondary | ICD-10-CM | POA: Diagnosis not present

## 2019-12-14 DIAGNOSIS — E1165 Type 2 diabetes mellitus with hyperglycemia: Secondary | ICD-10-CM | POA: Diagnosis present

## 2019-12-14 DIAGNOSIS — M546 Pain in thoracic spine: Secondary | ICD-10-CM | POA: Diagnosis not present

## 2019-12-14 LAB — CBC WITH DIFFERENTIAL/PLATELET
Abs Immature Granulocytes: 0.03 10*3/uL (ref 0.00–0.07)
Basophils Absolute: 0 10*3/uL (ref 0.0–0.1)
Basophils Relative: 0 %
Eosinophils Absolute: 0 10*3/uL (ref 0.0–0.5)
Eosinophils Relative: 0 %
HCT: 47.1 % (ref 39.0–52.0)
Hemoglobin: 15.9 g/dL (ref 13.0–17.0)
Immature Granulocytes: 0 %
Lymphocytes Relative: 14 %
Lymphs Abs: 1.8 10*3/uL (ref 0.7–4.0)
MCH: 30.6 pg (ref 26.0–34.0)
MCHC: 33.8 g/dL (ref 30.0–36.0)
MCV: 90.8 fL (ref 80.0–100.0)
Monocytes Absolute: 1.1 10*3/uL — ABNORMAL HIGH (ref 0.1–1.0)
Monocytes Relative: 9 %
Neutro Abs: 9.7 10*3/uL — ABNORMAL HIGH (ref 1.7–7.7)
Neutrophils Relative %: 77 %
Platelets: 241 10*3/uL (ref 150–400)
RBC: 5.19 MIL/uL (ref 4.22–5.81)
RDW: 13.2 % (ref 11.5–15.5)
WBC: 12.6 10*3/uL — ABNORMAL HIGH (ref 4.0–10.5)
nRBC: 0 % (ref 0.0–0.2)

## 2019-12-14 LAB — COMPREHENSIVE METABOLIC PANEL
ALT: 30 U/L (ref 0–44)
AST: 23 U/L (ref 15–41)
Albumin: 4.8 g/dL (ref 3.5–5.0)
Alkaline Phosphatase: 101 U/L (ref 38–126)
Anion gap: 8 (ref 5–15)
BUN: 10 mg/dL (ref 6–20)
CO2: 26 mmol/L (ref 22–32)
Calcium: 9.4 mg/dL (ref 8.9–10.3)
Chloride: 101 mmol/L (ref 98–111)
Creatinine, Ser: 1.24 mg/dL (ref 0.61–1.24)
GFR calc Af Amer: >60 mL/min (ref >60)
GFR calc non Af Amer: >60 mL/min (ref >60)
Glucose, Bld: 197 mg/dL — ABNORMAL HIGH (ref 70–99)
Potassium: 3.8 mmol/L (ref 3.5–5.1)
Sodium: 135 mmol/L (ref 135–145)
Total Bilirubin: 1.2 mg/dL (ref 0.3–1.2)
Total Protein: 8.7 g/dL — ABNORMAL HIGH (ref 6.5–8.1)

## 2019-12-14 LAB — URINALYSIS, ROUTINE W REFLEX MICROSCOPIC
Bilirubin Urine: NEGATIVE
Glucose, UA: >=500 mg/dL — AB
Hgb urine dipstick: NEGATIVE
Ketones, ur: 5 mg/dL — AB
Leukocytes,Ua: NEGATIVE
Nitrite: NEGATIVE
Protein, ur: 30 mg/dL — AB
Specific Gravity, Urine: >1.046 — ABNORMAL HIGH (ref 1.005–1.030)
pH: 5 (ref 5.0–8.0)

## 2019-12-14 LAB — PROTIME-INR
INR: 1 (ref 0.8–1.2)
Prothrombin Time: 13.2 seconds (ref 11.4–15.2)

## 2019-12-14 LAB — D-DIMER, QUANTITATIVE: D-Dimer, Quant: 8.11 ug/mL-FEU — ABNORMAL HIGH (ref 0.00–0.50)

## 2019-12-14 LAB — CBG MONITORING, ED: Glucose-Capillary: 164 mg/dL — ABNORMAL HIGH (ref 70–99)

## 2019-12-14 LAB — SARS CORONAVIRUS 2 (TAT 6-24 HRS): SARS Coronavirus 2: NEGATIVE

## 2019-12-14 LAB — APTT: aPTT: 28 seconds (ref 24–36)

## 2019-12-14 MED ORDER — ONDANSETRON HCL 4 MG/2ML IJ SOLN: 4.0000 mg | Freq: Four times a day (QID) | INTRAMUSCULAR | Status: DC | PRN

## 2019-12-14 MED ORDER — ACETAMINOPHEN 325 MG PO TABS
650.0000 mg | ORAL_TABLET | Freq: Four times a day (QID) | ORAL | Status: DC | PRN
  Administered 2019-12-15 – 2019-12-16 (×2): 650 mg via ORAL
  Filled 2019-12-14 (×2): qty 2

## 2019-12-14 MED ORDER — KETOROLAC TROMETHAMINE 30 MG/ML IJ SOLN
15.0000 mg | Freq: Once | INTRAMUSCULAR | Status: AC
  Administered 2019-12-14: 15 mg via INTRAVENOUS
  Filled 2019-12-14: qty 1

## 2019-12-14 MED ORDER — SODIUM CHLORIDE 0.9 % IV BOLUS
1000.0000 mL | Freq: Once | INTRAVENOUS | Status: AC
  Administered 2019-12-14: 1000 mL via INTRAVENOUS

## 2019-12-14 MED ORDER — HEPARIN BOLUS VIA INFUSION
4000.0000 [IU] | Freq: Once | INTRAVENOUS | Status: AC
  Administered 2019-12-14: 4000 [IU] via INTRAVENOUS
  Filled 2019-12-14: qty 4000

## 2019-12-14 MED ORDER — PRAVASTATIN SODIUM 40 MG PO TABS
40.0000 mg | ORAL_TABLET | Freq: Every day | ORAL | Status: DC
  Administered 2019-12-14 – 2019-12-15 (×2): 40 mg via ORAL
  Filled 2019-12-14: qty 1
  Filled 2019-12-14: qty 2

## 2019-12-14 MED ORDER — METHOCARBAMOL 500 MG PO TABS
1000.0000 mg | ORAL_TABLET | Freq: Once | ORAL | Status: AC
  Administered 2019-12-14: 1000 mg via ORAL
  Filled 2019-12-14: qty 2

## 2019-12-14 MED ORDER — CITALOPRAM HYDROBROMIDE 20 MG PO TABS
20.0000 mg | ORAL_TABLET | Freq: Every day | ORAL | Status: DC
  Administered 2019-12-15 – 2019-12-16 (×2): 20 mg via ORAL
  Filled 2019-12-14: qty 1
  Filled 2019-12-14: qty 2

## 2019-12-14 MED ORDER — INSULIN ASPART 100 UNIT/ML ~~LOC~~ SOLN
0.0000 [IU] | Freq: Three times a day (TID) | SUBCUTANEOUS | Status: DC
  Administered 2019-12-15: 1 [IU] via SUBCUTANEOUS
  Administered 2019-12-15: 2 [IU] via SUBCUTANEOUS
  Filled 2019-12-14: qty 0.09

## 2019-12-14 MED ORDER — HEPARIN (PORCINE) 25000 UT/250ML-% IV SOLN
1300.0000 [IU]/h | INTRAVENOUS | Status: AC
  Administered 2019-12-14: 1400 [IU]/h via INTRAVENOUS
  Administered 2019-12-15 (×2): 1300 [IU]/h via INTRAVENOUS
  Filled 2019-12-14 (×2): qty 250

## 2019-12-14 MED ORDER — IOHEXOL 350 MG/ML SOLN
100.0000 mL | Freq: Once | INTRAVENOUS | Status: AC | PRN
  Administered 2019-12-14: 100 mL via INTRAVENOUS

## 2019-12-14 MED ORDER — INSULIN ASPART 100 UNIT/ML ~~LOC~~ SOLN
0.0000 [IU] | Freq: Every day | SUBCUTANEOUS | Status: DC
  Filled 2019-12-14: qty 0.05

## 2019-12-14 NOTE — Discharge Instructions (Addendum)
Information on my medicine - XARELTO (rivaroxaban)  WHY WAS XARELTO PRESCRIBED FOR YOU? Xarelto was prescribed to treat blood clots that may have been found in your lungs (pulmonary embolism) and to reduce the risk of them occurring again.  What do you need to know about Xarelto? The starting dose is one 15 mg tablet taken TWICE daily with food for the FIRST 21 DAYS then on 01/05/2020, the dose is changed to one 20 mg tablet taken ONCE A DAY with your evening meal.  DO NOT stop taking Xarelto without talking to the health care provider who prescribed the medication.  Refill your prescription for 20 mg tablets before you run out.  After discharge, you should have regular check-up appointments with your healthcare provider that is prescribing your Xarelto.  In the future your dose may need to be changed if your kidney function changes by a significant amount.  What do you do if you miss a dose? If you are taking Xarelto TWICE DAILY and you miss a dose, take it as soon as you remember. You may take two 15 mg tablets (total 30 mg) at the same time then resume your regularly scheduled 15 mg twice daily the next day.  If you are taking Xarelto ONCE DAILY and you miss a dose, take it as soon as you remember on the same day then continue your regularly scheduled once daily regimen the next day. Do not take two doses of Xarelto at the same time.   Important Safety Information Xarelto is a blood thinner medicine that can cause bleeding. You should call your healthcare provider right away if you experience any of the following: ? Bleeding from an injury or your nose that does not stop. ? Unusual colored urine (red or dark brown) or unusual colored stools (red or black). ? Unusual bruising for unknown reasons. ? A serious fall or if you hit your head (even if there is no bleeding).  Some medicines may interact with Xarelto and might increase your risk of bleeding while on Xarelto. To help avoid  this, consult your healthcare provider or pharmacist prior to using any new prescription or non-prescription medications, including herbals, vitamins, non-steroidal anti-inflammatory drugs (NSAIDs) and supplements.  This website has more information on Xarelto: VisitDestination.com.br.

## 2019-12-14 NOTE — ED Provider Notes (Signed)
Patient received in sign out from S. Joy, PA-C. Patient presented with right upper back pain for 3-4 days.. Pain seems to be pleuritic in nature. Patient is tachycardic at 103. No current increased oxygen demand. CXR revealed RLL infiltrate. D-dimer obtained, 8.11. CTA of chest requested, reveals segmental right lower lobe pulmonary emboli with likely right lower lobe infarct. Heparin initiated per pharmacy. Will request admission.  Results for orders placed or performed during the hospital encounter of 12/14/19  Comprehensive metabolic panel  Result Value Ref Range   Sodium 135 135 - 145 mmol/L   Potassium 3.8 3.5 - 5.1 mmol/L   Chloride 101 98 - 111 mmol/L   CO2 26 22 - 32 mmol/L   Glucose, Bld 197 (H) 70 - 99 mg/dL   BUN 10 6 - 20 mg/dL   Creatinine, Ser 1.24 0.61 - 1.24 mg/dL   Calcium 9.4 8.9 - 10.3 mg/dL   Total Protein 8.7 (H) 6.5 - 8.1 g/dL   Albumin 4.8 3.5 - 5.0 g/dL   AST 23 15 - 41 U/L   ALT 30 0 - 44 U/L   Alkaline Phosphatase 101 38 - 126 U/L   Total Bilirubin 1.2 0.3 - 1.2 mg/dL   GFR calc non Af Amer >60 >60 mL/min   GFR calc Af Amer >60 >60 mL/min   Anion gap 8 5 - 15  CBC with Differential  Result Value Ref Range   WBC 12.6 (H) 4.0 - 10.5 K/uL   RBC 5.19 4.22 - 5.81 MIL/uL   Hemoglobin 15.9 13.0 - 17.0 g/dL   HCT 47.1 39.0 - 52.0 %   MCV 90.8 80.0 - 100.0 fL   MCH 30.6 26.0 - 34.0 pg   MCHC 33.8 30.0 - 36.0 g/dL   RDW 13.2 11.5 - 15.5 %   Platelets 241 150 - 400 K/uL   nRBC 0.0 0.0 - 0.2 %   Neutrophils Relative % 77 %   Neutro Abs 9.7 (H) 1.7 - 7.7 K/uL   Lymphocytes Relative 14 %   Lymphs Abs 1.8 0.7 - 4.0 K/uL   Monocytes Relative 9 %   Monocytes Absolute 1.1 (H) 0.1 - 1.0 K/uL   Eosinophils Relative 0 %   Eosinophils Absolute 0.0 0.0 - 0.5 K/uL   Basophils Relative 0 %   Basophils Absolute 0.0 0.0 - 0.1 K/uL   Immature Granulocytes 0 %   Abs Immature Granulocytes 0.03 0.00 - 0.07 K/uL  D-dimer, quantitative  Result Value Ref Range   D-Dimer,  Quant 8.11 (H) 0.00 - 0.50 ug/mL-FEU   DG Chest 2 View  Result Date: 12/14/2019 CLINICAL DATA:  Right-sided pain for several years with increase over the last 24 hours. EXAM: CHEST - 2 VIEW COMPARISON:  None. FINDINGS: Cardiac shadow is mildly enlarged. Increased density is noted in the right lung base consistent with right lower lobe infiltrate and associated effusion. This is likely the etiology of the increased pleuritic pain. No bony abnormality is seen. IMPRESSION: New right basilar infiltrate. Electronically Signed   By: Inez Catalina M.D.   On: 12/14/2019 12:41   CT Angio Chest PE W and/or Wo Contrast  Result Date: 12/14/2019 CLINICAL DATA:  Elevated D-dimer, intermittent back pain EXAM: CT ANGIOGRAPHY CHEST WITH CONTRAST TECHNIQUE: Multidetector CT imaging of the chest was performed using the standard protocol during bolus administration of intravenous contrast. Multiplanar CT image reconstructions and MIPs were obtained to evaluate the vascular anatomy. CONTRAST:  134mL OMNIPAQUE IOHEXOL 350 MG/ML SOLN COMPARISON:  12/14/2019  FINDINGS: Cardiovascular: This is a technically adequate evaluation of the pulmonary vasculature. Segmental right lower lobe pulmonary emboli are seen, most pronounced within the posterior basilar segment. No other filling defects are identified. The heart is unremarkable without pericardial effusion. Thoracic aorta is unremarkable without aneurysm or dissection. Mediastinum/Nodes: 1 cm short axis right paratracheal lymph node reference image 46, nonspecific. No pathologically enlarged adenopathy within the mediastinum, hila, or axillary regions otherwise. Lungs/Pleura: Consolidation within the posterior basilar segment right lower lobe in the distribution of the pulmonary embolus may reflect infarct. No effusion or pneumothorax. Central airways are patent. Upper Abdomen: No acute abnormality. Musculoskeletal: No acute or destructive bony lesions. Reconstructed images demonstrate  no additional findings. Review of the MIP images confirms the above findings. IMPRESSION: 1. Segmental right lower lobe pulmonary emboli with likely right lower lobe infarct as above. 2. Borderline enlarged right paratracheal lymph node, nonspecific. These results were called by telephone at the time of interpretation on 12/14/2019 at 4:03 pm to provider [facility] , who verbally acknowledged these results. Electronically Signed   By: Sharlet Salina M.D.   On: 12/14/2019 16:03     Felicie Morn, NP 12/15/19 Claiborne Billings, MD 12/17/19 [phone]

## 2019-12-14 NOTE — ED Triage Notes (Signed)
Complains of back pain on and off x3 years, stated that he slept wrong last night and tweaked his back, it feels like he has a charley horse. Unable to ambulate due to the pain.

## 2019-12-14 NOTE — Progress Notes (Signed)
ANTICOAGULATION CONSULT NOTE - Initial Consult  Pharmacy Consult for Heparin Indication: pulmonary embolus  Allergies  Allergen Reactions  . Other Anaphylaxis    Seafood.     Patient Measurements: Height: 5\' 7"  (170.2 cm) Weight: 194 lb (88 kg) IBW/kg (Calculated) : 66.1 Heparin Dosing Weight: 84 kg  Vital Signs: Temp: 99 F (37.2 C) (02/09 1042) Temp Source: Oral (02/09 1042) BP: 115/79 (02/09 1500) Pulse Rate: 103 (02/09 1515)  Labs: Recent Labs    12/14/19 1221  HGB 15.9  HCT 47.1  PLT 241  CREATININE 1.24    Estimated Creatinine Clearance: 70.5 mL/min (by C-G formula based on SCr of 1.24 mg/dL).   Medical History: Past Medical History:  Diagnosis Date  . Asthma   . Back pain, chronic   . Chronic back pain 12/26/2012  . Hyperlipidemia LDL goal < 100 12/26/2012    Medications:  No oral anticoagulations PTA  Assessment:  59 yr male presents with back pain.  PMH significant for asthma, chronic back pain, HLD  CTAngio = + pulmonary embolism  Pharmacy consulted to dose IV heparin  Goal of Therapy:  Heparin level 0.3-0.7 units/ml Monitor platelets by anticoagulation protocol: Yes   Plan:   Obtain baseline aPTT and PT/INR  Heparin 4000 unit IV bolus x 1 followed by heparin gtt @ 1400 units/hr  Check heparin level 6 hr after IV heparin gtt started  Follow daily CBC and heparin level while on IV heparin  Shane Oliver, 61, PharmD 12/14/2019,4:29 PM

## 2019-12-14 NOTE — ED Provider Notes (Addendum)
[facility]-EMERGENCY DEPT Provider Note   CSN: [id number] Arrival date & time: 12/14/19  1029     History Chief Complaint  Patient presents with  . Back Pain    Shane Oliver is a 59 y.o. male.  HPI      Shane Oliver is a 59 y.o. male, with a history of chronic lower back pain and asthma, presenting to the ED with right mid back pain starting 3 to 4 days ago.  He was lying in bed at the time of onset and pain came on suddenly.  He has not felt this pain before.  Pain is sharp, worse with deep breathing, 10/10, radiating towards the right shoulder.  Occasional muscle spasms, cough, and sensation of shortness of breath.  He states his chronic back pain is in the lower back.  He has tried taking his gabapentin and tramadol, as well as ibuprofen without improvement in his pain.  Denies fever/chills, chest pain, abdominal pain, dizziness, syncope, numbness, weakness, falls/trauma, lower extremity edema/pain, urinary symptoms, or any other complaints.  Past Medical History:  Diagnosis Date  . Asthma   . Back pain, chronic   . Chronic back pain 12/26/2012  . Hyperlipidemia LDL goal < 100 12/26/2012    Patient Active Problem List   Diagnosis Date Noted  . Prediabetes 12/26/2012  . Elevated LDL cholesterol level 12/26/2012  . Hyperlipidemia LDL goal < 100 12/26/2012  . Chronic back pain 12/26/2012    History reviewed. No pertinent surgical history.     Family History  Problem Relation Age of Onset  . Diabetes Mother   . Stroke Father   . Diabetes Father     Social History   Tobacco Use  . Smoking status: Never Smoker  . Smokeless tobacco: Never Used  Substance Use Topics  . Alcohol use: No  . Drug use: No    Home Medications Prior to Admission medications   Medication Sig Start Date End Date Taking? Authorizing Provider  albuterol (PROVENTIL HFA;VENTOLIN HFA) 108 (90 BASE) MCG/ACT inhaler Inhale 2 puffs into the lungs every 6 (six)  hours as needed. For shortness of breath    [provider]  cholecalciferol (VITAMIN D) 1000 UNITS tablet Take 1,000 Units by mouth daily.    [provider]  cyclobenzaprine (FLEXERIL) 10 MG tablet Take 1 tablet (10 mg total) by mouth 2 (two) times daily as needed for muscle spasms. 01/20/19   Janne Napoleon, NP  EPINEPHrine (EPIPEN) 0.3 mg/0.3 mL IJ SOAJ injection Inject 0.3 mg into the muscle once.    [provider]  HYDROcodone-acetaminophen (NORCO/VICODIN) 5-325 MG per tablet Take 1-2 tablets by mouth every 6 hours as needed for pain. 05/28/14   Pisciotta, Joni Reining, PA-C  ibuprofen (ADVIL,MOTRIN) 200 MG tablet Take 400 mg by mouth every 6 (six) hours as needed for moderate pain.    [provider]  methocarbamol (ROBAXIN) 500 MG tablet Take 1 tablet (500 mg total) by mouth 2 (two) times daily. 03/16/18   Dartha Lodge, PA-C  methylPREDNISolone (MEDROL DOSEPAK) 4 MG TBPK tablet Take as directed 03/16/18   Dartha Lodge, PA-C  nabumetone (RELAFEN) 750 MG tablet Take 1 tablet (750 mg total) by mouth 2 (two) times daily as needed for pain. Patient not taking: Reported on 08/26/2017 12/25/12   Cleora Fleet, MD    Allergies    Other  Review of Systems   Review of Systems  Constitutional: Negative for chills, diaphoresis and fever.  Respiratory: Positive for cough and shortness of breath.   Cardiovascular: Negative for chest pain and leg swelling.  Gastrointestinal: Negative for abdominal pain, diarrhea, nausea and vomiting.  Genitourinary: Negative for dysuria, flank pain, frequency and hematuria.  Musculoskeletal: Positive for back pain.  Neurological: Negative for dizziness, syncope, weakness and numbness.  All other systems reviewed and are negative.   Physical Exam Updated Vital Signs BP 115/82 (BP Location: Left Arm)   Pulse (!) 136   Temp 99 F (37.2 C) (Oral)   Resp 18   Ht 5\' 7"  (1.702 m)   Wt 88 kg   SpO2 98%   BMI 30.38 kg/m    Physical Exam Vitals and nursing note reviewed.  Constitutional:      General: He is not in acute distress.    Appearance: He is well-developed. He is not diaphoretic.  HENT:     Head: Normocephalic and atraumatic.     Mouth/Throat:     Mouth: Mucous membranes are moist.     Pharynx: Oropharynx is clear.  Eyes:     Conjunctiva/sclera: Conjunctivae normal.  Cardiovascular:     Rate and Rhythm: Regular rhythm. Tachycardia present.     Pulses: Normal pulses.          Radial pulses are 2+ on the right side and 2+ on the left side.       Posterior tibial pulses are 2+ on the right side and 2+ on the left side.     Heart sounds: Normal heart sounds.     Comments: Tactile temperature in the extremities appropriate and equal bilaterally. Pulmonary:     Effort: Pulmonary effort is normal. No respiratory distress.     Breath sounds: Normal breath sounds.  Abdominal:     Palpations: Abdomen is soft.     Tenderness: There is no abdominal tenderness. There is no guarding.  Musculoskeletal:     Cervical back: Neck supple.       Back:     Right lower leg: No edema.     Left lower leg: No edema.     Comments: Patient does have some tenderness in the right thoracic back, however, it does not seem as though he has tenderness consistent with the amount of pain he reports.  No noted skin abnormalities.  No lower extremity edema, tenderness, pain, color change, or increased warmth.  Lymphadenopathy:     Cervical: No cervical adenopathy.  Skin:    General: Skin is warm and dry.  Neurological:     Mental Status: He is alert.     Comments: No noted acute cognitive deficit. Sensation grossly intact to light touch in the extremities.   Grip strengths equal bilaterally.   Strength 5/5 in all extremities.  No gait disturbance.  Coordination intact.  Cranial nerves III-XII grossly intact.  Handles oral secretions without noted difficulty.  No noted phonation or speech deficit. No facial droop.    Psychiatric:        Mood and Affect: Mood and affect normal.        Speech: Speech normal.        Behavior: Behavior normal.     ED Results / Procedures / Treatments   Labs (all labs ordered are listed, but only abnormal results are displayed) Labs Reviewed  COMPREHENSIVE METABOLIC PANEL - Abnormal; Notable for the following components:      Result Value   Glucose, Bld 197 (*)    Total Protein 8.7 (*)    All other  components within normal limits  CBC WITH DIFFERENTIAL/PLATELET - Abnormal; Notable for the following components:   WBC 12.6 (*)    Neutro Abs 9.7 (*)    Monocytes Absolute 1.1 (*)    All other components within normal limits  D-DIMER, QUANTITATIVE (NOT AT [facility]) - Abnormal; Notable for the following components:   D-Dimer, Quant 8.11 (*)    All other components within normal limits  SARS CORONAVIRUS 2 (TAT 6-24 HRS)  URINALYSIS, ROUTINE W REFLEX MICROSCOPIC    EKG EKG Interpretation  Date/Time:  Tuesday December 14 2019 12:05:35 EST Ventricular Rate:  122 PR Interval:    QRS Duration: 81 QT Interval:  303 QTC Calculation: 434 R Axis:   73 Text Interpretation: new Sinus tachycardia ST elevation, consider inferior injury Baseline wander in lead(s) V4 Confirmed by Gwyneth Sprout ([id number]) on 12/14/2019 3:46:01 PM   Radiology DG Chest 2 View  Result Date: 12/14/2019 CLINICAL DATA:  Right-sided pain for several years with increase over the last 24 hours. EXAM: CHEST - 2 VIEW COMPARISON:  None. FINDINGS: Cardiac shadow is mildly enlarged. Increased density is noted in the right lung base consistent with right lower lobe infiltrate and associated effusion. This is likely the etiology of the increased pleuritic pain. No bony abnormality is seen. IMPRESSION: New right basilar infiltrate. Electronically Signed   By: Alcide Clever M.D.   On: 12/14/2019 12:41    Procedures .Critical Care Performed by: Anselm Pancoast, PA-C Authorized by: Anselm Pancoast, PA-C   Critical  care provider statement:    Critical care time (minutes):  45   Critical care time was exclusive of:  Separately billable procedures and treating other patients   Critical care was necessary to treat or prevent imminent or life-threatening deterioration of the following conditions: Acute pulmonary embolism.   Critical care was time spent personally by me on the following activities:  Development of treatment plan with patient or surrogate, ordering and performing treatments and interventions, ordering and review of laboratory studies, ordering and review of radiographic studies, pulse oximetry, re-evaluation of patient's condition, review of old charts, obtaining history from patient or surrogate, examination of patient and evaluation of patient's response to treatment   I assumed direction of critical care for this patient from another provider in my specialty: no     (including critical care time)  Medications Ordered in ED Medications  ketorolac (TORADOL) 30 MG/ML injection 15 mg (15 mg Intravenous Given 12/14/19 1216)  sodium chloride 0.9 % bolus 1,000 mL (0 mLs Intravenous Stopped 12/14/19 1338)  methocarbamol (ROBAXIN) tablet 1,000 mg (1,000 mg Oral Given 12/14/19 1215)  iohexol (OMNIPAQUE) 350 MG/ML injection 100 mL (100 mLs Intravenous Contrast Given 12/14/19 1538)    ED Course  I have reviewed the triage vital signs and the nursing notes.  Pertinent labs & imaging results that were available during my care of the patient were reviewed by me and considered in my medical decision making (see chart for details).  Clinical Course as of Dec 13 1549  Tue Dec 14, 2019  1151 Manual pulse rate 104.   [SJ]  1426 Patient states he feels much better.  His pain has resolved.   [SJ]    Clinical Course User Index [SJ] Shamal Stracener, Hillard Danker, PA-C   MDM Rules/Calculators/A&P                      Patient presents with atraumatic back pain.  No focal neurologic deficits. Patient is nontoxic  appearing,  afebrile, not tachypneic, not hypotensive, maintains excellent SPO2 on room air, and is in no apparent distress.  He was, however, tachycardic.  No leukocytosis. Patient does have tenderness on exam, however, the pain he reports seemed to be out of proportion with what was able to be reproduced on exam.  This observation, combined with his tachycardia, led me to pursue further evaluation. Well score is 1.5, indicating low risk for PE. There is a new right basilar infiltrate on chest x-ray, which could be responsible for the patient's pain, however, patient also had a significantly elevated D-dimer.   End of shift patient care handoff report given to Etta Quill, NP.  Plan: Patient awaiting CTA PE study.  If no PE, plan to treat patient for CAP and discharge.  Vitals:   12/14/19 1430 12/14/19 1445 12/14/19 1500 12/14/19 1515  BP: 113/79  115/79   Pulse: 99 (!) 106 (!) 103 (!) 103  Resp: 15 17 19  (!) 23  Temp:      TempSrc:      SpO2: 98% 98% 98% 97%  Weight:      Height:        Final Clinical Impression(s) / ED Diagnoses Final diagnoses:  None    Rx / DC Orders ED Discharge Orders    None       Layla Maw 12/14/19 1551    Blanchie Dessert, MD 12/17/19 1643   Added Critical Care Time.     Lorayne Bender, PA-C 12/29/19 [id number]    Blanchie Dessert, MD 01/01/20 1513
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
## 2019-12-15 ENCOUNTER — Inpatient Hospital Stay (HOSPITAL_COMMUNITY): Payer: Medicaid Other

## 2019-12-15 DIAGNOSIS — G8929 Other chronic pain: Secondary | ICD-10-CM

## 2019-12-15 DIAGNOSIS — I2699 Other pulmonary embolism without acute cor pulmonale: Secondary | ICD-10-CM

## 2019-12-15 DIAGNOSIS — I2609 Other pulmonary embolism with acute cor pulmonale: Secondary | ICD-10-CM

## 2019-12-15 DIAGNOSIS — R0602 Shortness of breath: Secondary | ICD-10-CM

## 2019-12-15 DIAGNOSIS — M5440 Lumbago with sciatica, unspecified side: Secondary | ICD-10-CM

## 2019-12-15 LAB — COMPREHENSIVE METABOLIC PANEL
ALT: 23 U/L (ref 0–44)
AST: 18 U/L (ref 15–41)
Albumin: 3.6 g/dL (ref 3.5–5.0)
Alkaline Phosphatase: 73 U/L (ref 38–126)
Anion gap: 7 (ref 5–15)
BUN: 12 mg/dL (ref 6–20)
CO2: 26 mmol/L (ref 22–32)
Calcium: 8.6 mg/dL — ABNORMAL LOW (ref 8.9–10.3)
Chloride: 107 mmol/L (ref 98–111)
Creatinine, Ser: 1.03 mg/dL (ref 0.61–1.24)
GFR calc Af Amer: >60 mL/min (ref >60)
GFR calc non Af Amer: >60 mL/min (ref >60)
Glucose, Bld: 151 mg/dL — ABNORMAL HIGH (ref 70–99)
Potassium: 3.4 mmol/L — ABNORMAL LOW (ref 3.5–5.1)
Sodium: 140 mmol/L (ref 135–145)
Total Bilirubin: 1.1 mg/dL (ref 0.3–1.2)
Total Protein: 6.6 g/dL (ref 6.5–8.1)

## 2019-12-15 LAB — CBC WITH DIFFERENTIAL/PLATELET
Abs Immature Granulocytes: 0.05 10*3/uL (ref 0.00–0.07)
Basophils Absolute: 0 10*3/uL (ref 0.0–0.1)
Basophils Relative: 0 %
Eosinophils Absolute: 0 10*3/uL (ref 0.0–0.5)
Eosinophils Relative: 0 %
HCT: 36.9 % — ABNORMAL LOW (ref 39.0–52.0)
Hemoglobin: 12.7 g/dL — ABNORMAL LOW (ref 13.0–17.0)
Immature Granulocytes: 1 %
Lymphocytes Relative: 19 %
Lymphs Abs: 1.8 10*3/uL (ref 0.7–4.0)
MCH: 30.8 pg (ref 26.0–34.0)
MCHC: 34.4 g/dL (ref 30.0–36.0)
MCV: 89.3 fL (ref 80.0–100.0)
Monocytes Absolute: 1 10*3/uL (ref 0.1–1.0)
Monocytes Relative: 10 %
Neutro Abs: 6.7 10*3/uL (ref 1.7–7.7)
Neutrophils Relative %: 70 %
Platelets: 181 10*3/uL (ref 150–400)
RBC: 4.13 MIL/uL — ABNORMAL LOW (ref 4.22–5.81)
RDW: 13 % (ref 11.5–15.5)
WBC: 9.5 10*3/uL (ref 4.0–10.5)
nRBC: 0 % (ref 0.0–0.2)

## 2019-12-15 LAB — ECHOCARDIOGRAM COMPLETE
Height: 67 in
Weight: 3104 oz

## 2019-12-15 LAB — CBG MONITORING, ED
Glucose-Capillary: 181 mg/dL — ABNORMAL HIGH (ref 70–99)
Glucose-Capillary: 127 mg/dL — ABNORMAL HIGH (ref 70–99)

## 2019-12-15 LAB — GLUCOSE, CAPILLARY
Glucose-Capillary: 148 mg/dL — ABNORMAL HIGH (ref 70–99)
Glucose-Capillary: 149 mg/dL — ABNORMAL HIGH (ref 70–99)

## 2019-12-15 LAB — HEPARIN LEVEL (UNFRACTIONATED): Heparin Unfractionated: 0.87 IU/mL — ABNORMAL HIGH (ref 0.30–0.70)

## 2019-12-15 LAB — PROCALCITONIN
Procalcitonin: 0.63 ng/mL
Procalcitonin: 0.56 ng/mL

## 2019-12-15 LAB — STREP PNEUMONIAE URINARY ANTIGEN: Strep Pneumo Urinary Antigen: NEGATIVE

## 2019-12-15 LAB — LACTIC ACID, PLASMA: Lactic Acid, Venous: 1.4 mmol/L (ref 0.5–1.9)

## 2019-12-15 MED ORDER — ALBUTEROL SULFATE HFA 108 (90 BASE) MCG/ACT IN AERS: 2.0000 | INHALATION_SPRAY | Freq: Four times a day (QID) | RESPIRATORY_TRACT | Status: DC | PRN

## 2019-12-15 MED ORDER — SODIUM CHLORIDE 0.9 % IV SOLN
1.0000 g | INTRAVENOUS | Status: DC
  Administered 2019-12-15: 1 g via INTRAVENOUS
  Filled 2019-12-15: qty 10
  Filled 2019-12-15: qty 1

## 2019-12-15 MED ORDER — RIVAROXABAN 20 MG PO TABS: 20.0000 mg | ORAL_TABLET | Freq: Every day | ORAL | Status: DC

## 2019-12-15 MED ORDER — HYDROMORPHONE HCL 1 MG/ML IJ SOLN
1.0000 mg | INTRAMUSCULAR | Status: DC | PRN
  Administered 2019-12-15: 1 mg via INTRAVENOUS
  Filled 2019-12-15: qty 1

## 2019-12-15 MED ORDER — SODIUM CHLORIDE 0.9 % IV SOLN
500.0000 mg | INTRAVENOUS | Status: DC
  Administered 2019-12-15: 500 mg via INTRAVENOUS
  Filled 2019-12-15: qty 500

## 2019-12-15 MED ORDER — POTASSIUM CHLORIDE CRYS ER 20 MEQ PO TBCR
40.0000 meq | EXTENDED_RELEASE_TABLET | Freq: Once | ORAL | Status: AC
  Administered 2019-12-15: 40 meq via ORAL
  Filled 2019-12-15: qty 2

## 2019-12-15 MED ORDER — TRAMADOL HCL 50 MG PO TABS: 100.0000 mg | ORAL_TABLET | Freq: Two times a day (BID) | ORAL | Status: DC

## 2019-12-15 MED ORDER — SODIUM CHLORIDE 0.9 % IV SOLN: Freq: Once | INTRAVENOUS | Status: AC

## 2019-12-15 MED ORDER — TRAMADOL HCL 50 MG PO TABS: 100.0000 mg | ORAL_TABLET | Freq: Two times a day (BID) | ORAL | Status: DC | PRN

## 2019-12-15 MED ORDER — GABAPENTIN 400 MG PO CAPS
400.0000 mg | ORAL_CAPSULE | Freq: Three times a day (TID) | ORAL | Status: DC
  Administered 2019-12-15 – 2019-12-16 (×3): 400 mg via ORAL
  Filled 2019-12-15 (×3): qty 1

## 2019-12-15 MED ORDER — GABAPENTIN 300 MG PO CAPS
300.0000 mg | ORAL_CAPSULE | Freq: Three times a day (TID) | ORAL | Status: DC
  Administered 2019-12-15: 300 mg via ORAL
  Filled 2019-12-15: qty 1

## 2019-12-15 MED ORDER — RIVAROXABAN 15 MG PO TABS
15.0000 mg | ORAL_TABLET | Freq: Two times a day (BID) | ORAL | Status: DC
  Administered 2019-12-15 – 2019-12-16 (×2): 15 mg via ORAL
  Filled 2019-12-15 (×3): qty 1

## 2019-12-15 MED ORDER — TRAMADOL HCL 50 MG PO TABS: 100.0000 mg | ORAL_TABLET | Freq: Every day | ORAL | Status: DC

## 2019-12-15 NOTE — Progress Notes (Signed)
ANTICOAGULATION CONSULT NOTE - Follow Up Consult  Pharmacy Consult for Heparin Indication: pulmonary embolus  Allergies  Allergen Reactions  . Other Anaphylaxis    Seafood.     Patient Measurements: Height: 5\' 7"  (170.2 cm) Weight: 194 lb (88 kg) IBW/kg (Calculated) : 66.1 Heparin Dosing Weight:   Vital Signs: BP: 120/80 (02/10 0100) Pulse Rate: 102 (02/10 0100)  Labs: Recent Labs    12/14/19 1221 12/14/19 1235 12/15/19 0245  HGB 15.9  --   --   HCT 47.1  --   --   PLT 241  --   --   APTT  --  28  --   LABPROT  --  13.2  --   INR  --  1.0  --   HEPARINUNFRC  --   --  0.87*  CREATININE 1.24  --   --     Estimated Creatinine Clearance: 70.5 mL/min (by C-G formula based on SCr of 1.24 mg/dL).   Medications:  Infusions:  . heparin 1,400 Units/hr (12/15/19 0300)    Assessment: Patient with high heparin level.  No heparin issues noted.   Goal of Therapy:  Heparin level 0.3-0.7 units/ml Monitor platelets by anticoagulation protocol: Yes   Plan:  Decrease heparin to 1300 units/hr Recheck level at [address] [place] 12/15/2019,3:40 AM

## 2019-12-15 NOTE — Progress Notes (Signed)
ANTICOAGULATION CONSULT NOTE  Pharmacy Consult for IV heparin --> PO Xarelto  Indication: pulmonary embolus  Allergies  Allergen Reactions  . Other Anaphylaxis    Seafood.     Patient Measurements: Height: 5\' 7"  (170.2 cm) Weight: 194 lb (88 kg) IBW/kg (Calculated) : 66.1   Vital Signs: Temp: 98.6 F (37 C) (02/10 0400) Temp Source: Oral (02/10 0400) BP: 122/83 (02/10 1500) Pulse Rate: 107 (02/10 1500)  Labs: Recent Labs    12/14/19 1221 12/14/19 1235 12/15/19 0245 12/15/19 0555  HGB 15.9  --   --  12.7*  HCT 47.1  --   --  36.9*  PLT 241  --   --  181  APTT  --  28  --   --   LABPROT  --  13.2  --   --   INR  --  1.0  --   --   HEPARINUNFRC  --   --  0.87*  --   CREATININE 1.24  --   --  1.03    Estimated Creatinine Clearance: 84.8 mL/min (by C-G formula based on SCr of 1.03 mg/dL).   Medical History: Past Medical History:  Diagnosis Date  . Asthma   . Back pain, chronic   . Chronic back pain 12/26/2012  . Hyperlipidemia LDL goal < 100 12/26/2012    Medications:  No anticoagulants PTA  Assessment: 59 y/o male presented to [facility] ED on 2/9 with back pain, SOB.  PMH significant for asthma, chronic back pain, HLD. CT angio  + for pulmonary embolism. Pharmacy consulted to dose IV heparin on 2/9, asked to transition to PO Xarelto on 2/10.    SCr 1.03  CBC: Hgb 12.7, Pltc WNL  No bleeding issues noted per nursing  Goal of Therapy:  Treatment of VTE Monitor platelets by anticoagulation protocol: Yes   Plan:   Stop heparin infusion at 5pm  At the same time, start Xarelto 15mg  PO BID with meals x 21 days, then 20mg  PO daily with supper thereafter  Monitor CBC, renal function, and for s/sx of bleeding  Pharmacy to provide education prior to discharge    4/10, PharmD, BCPS Clinical Pharmacist 12/15/2019,3:45 PM
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
## 2019-12-15 NOTE — Progress Notes (Signed)
Received pt from ED, with elevated temp of 101.8, MD updated and Yellow MEWS to RED MEWS protocol started. Pt stable notified  RR RN to discuss pt status. Pt VSs noted a/o is not in distress, CN updated, MD placed orders and order implemented. Pt c/o back pain (chronic) medicated as ordered. VSs continued per RED MEWS protocol. Tylenol given and antibiotic administered.Handoff to oncoming RN . SRP, RN

## 2019-12-15 NOTE — Progress Notes (Signed)
Bilateral lower extremity venous duplex has been completed. Preliminary results can be found in CV Proc through chart review.   12/15/19 9:23 AM Olen Cordial RVT

## 2019-12-15 NOTE — TOC Initial Note (Addendum)
Transition of Care [facility]) - Initial/Assessment Note    Patient Details  Name: Shane Oliver MRN: [id number] Date of Birth: 1961/05/11  [facility] CM/SW Contact:    Elliot Cousin, RN Phone Number:  [phone] 12/15/2019, 11:38 AM  Clinical Narrative:   Referral to check coverage for Eliquis or Xarelto               TOC CM spoke to pt and states he does have a PCP, Dr Greggory Stallion Osei-Bonsu. His eldest son, Thayer Ohm takes him to his appt. Verified for [place] Medicaid Preferred Drug List both Xarelto and Eliquis are covered by Medicaid. Unit CM will check [facility] pharmacy prior to dc to ensure they have in stock. And provide a 30 day free trial card both Medications offer a free trial. Pt is requesting a cane at dc. Updated attending with info on Xarelto and Eliquis  Expected Discharge Plan: Home/Self Care Barriers to Discharge: Continued Medical Work up   Patient Goals and CMS Choice        Expected Discharge Plan and Services Expected Discharge Plan: Home/Self Care In-house Referral: Clinical Social Work Discharge Planning Services: CM Consult   Living arrangements for the past 2 months: Single Family Home                                      Prior Living Arrangements/Services Living arrangements for the past 2 months: Single Family Home Lives with:: Adult Children Patient language and need for interpreter reviewed:: Yes Do you feel safe going back to the place where you live?: Yes      Need for Family Participation in Patient Care: No (Comment) Care giver support system in place?: Yes (comment)   Criminal Activity/Legal Involvement Pertinent to Current Situation/Hospitalization: No - Comment as needed  Activities of Daily Living Home Assistive Devices/Equipment: Eyeglasses ADL Screening (condition at time of admission) Patient's cognitive ability adequate to safely complete daily activities?: Yes Is the patient deaf or have difficulty hearing?:  No Does the patient have difficulty seeing, even when wearing glasses/contacts?: No Does the patient have difficulty concentrating, remembering, or making decisions?: No Patient able to express need for assistance with ADLs?: Yes Does the patient have difficulty dressing or bathing?: Yes Independently performs ADLs?: No(secondary to back pain) Communication: Independent Dressing (OT): Needs assistance Is this a change from baseline?: Change from baseline, expected to last >3 days Grooming: Needs assistance Is this a change from baseline?: Change from baseline, expected to last >3 days Feeding: Needs assistance Is this a change from baseline?: Change from baseline, expected to last >3 days Bathing: Needs assistance Is this a change from baseline?: Change from baseline, expected to last >3 days Toileting: Dependent Is this a change from baseline?: Change from baseline, expected to last >3days In/Out Bed: Dependent Is this a change from baseline?: Change from baseline, expected to last >3 days Walks in Home: Dependent Is this a change from baseline?: Change from baseline, expected to last >3 days Does the patient have difficulty walking or climbing stairs?: Yes(secondary to back pain) Weakness of Legs: Both Weakness of Arms/Hands: None  Permission Sought/Granted Permission sought to share information with : Case Manager, PCP, Family Supports Permission granted to share information with : Yes, Verbal Permission Granted  Share Information with NAME: Lars Jeziorski  Permission granted to share info w AGENCY: [employer]  Permission granted to share  info w Relationship: son     Emotional Assessment   Attitude/Demeanor/Rapport: Engaged Affect (typically observed): Pleasant Orientation: : Oriented to Self, Oriented to Place, Oriented to Situation, Oriented to  Time   Psych Involvement: No (comment)  Admission diagnosis:  Pulmonary embolism ([place]) [I26.99] Patient Active  Problem List   Diagnosis Date Noted  . Pulmonary embolism ([place]) 12/14/2019  . Prediabetes 12/26/2012  . Elevated LDL cholesterol level 12/26/2012  . Hyperlipidemia LDL goal < 100 12/26/2012  . Chronic back pain 12/26/2012   PCP:  System, Pcp Not In Pharmacy:   [facility] [place], [place] - [address] [place] [address] Phone: [phone] Fax: [phone]     Social Determinants of Health (SDOH) Interventions    Readmission Risk Interventions No flowsheet data found.

## 2019-12-15 NOTE — Progress Notes (Signed)
Echocardiogram 2D Echocardiogram has been performed.   12/15/2019, 11:58 AM

## 2019-12-16 LAB — BASIC METABOLIC PANEL
Anion gap: 8 (ref 5–15)
BUN: 10 mg/dL (ref 6–20)
CO2: 24 mmol/L (ref 22–32)
Calcium: 8.5 mg/dL — ABNORMAL LOW (ref 8.9–10.3)
Chloride: 105 mmol/L (ref 98–111)
Creatinine, Ser: 0.98 mg/dL (ref 0.61–1.24)
GFR calc Af Amer: >60 mL/min (ref >60)
GFR calc non Af Amer: >60 mL/min (ref >60)
Glucose, Bld: 106 mg/dL — ABNORMAL HIGH (ref 70–99)
Potassium: 3.9 mmol/L (ref 3.5–5.1)
Sodium: 137 mmol/L (ref 135–145)

## 2019-12-16 LAB — CBC
HCT: 38.7 % — ABNORMAL LOW (ref 39.0–52.0)
Hemoglobin: 12.6 g/dL — ABNORMAL LOW (ref 13.0–17.0)
MCH: 30.1 pg (ref 26.0–34.0)
MCHC: 32.6 g/dL (ref 30.0–36.0)
MCV: 92.6 fL (ref 80.0–100.0)
Platelets: 194 10*3/uL (ref 150–400)
RBC: 4.18 MIL/uL — ABNORMAL LOW (ref 4.22–5.81)
RDW: 13.2 % (ref 11.5–15.5)
WBC: 8.6 10*3/uL (ref 4.0–10.5)
nRBC: 0 % (ref 0.0–0.2)

## 2019-12-16 LAB — GLUCOSE, CAPILLARY
Glucose-Capillary: 118 mg/dL — ABNORMAL HIGH (ref 70–99)
Glucose-Capillary: 138 mg/dL — ABNORMAL HIGH (ref 70–99)

## 2019-12-16 LAB — HEMOGLOBIN A1C
Hgb A1c MFr Bld: 7.3 % — ABNORMAL HIGH (ref 4.8–5.6)
Mean Plasma Glucose: 163 mg/dL

## 2019-12-16 MED ORDER — CEFPODOXIME PROXETIL 100 MG PO TABS: 100.0000 mg | ORAL_TABLET | Freq: Two times a day (BID) | ORAL | 0 refills | Status: DC

## 2019-12-16 MED ORDER — RIVAROXABAN (XARELTO) VTE STARTER PACK (15 & 20 MG): ORAL_TABLET | ORAL | 0 refills | Status: AC

## 2019-12-16 MED ORDER — CEFPODOXIME PROXETIL 200 MG PO TABS: 200.0000 mg | ORAL_TABLET | Freq: Two times a day (BID) | ORAL | 0 refills | Status: AC

## 2019-12-16 MED ORDER — RIVAROXABAN 20 MG PO TABS: 20.0000 mg | ORAL_TABLET | Freq: Every day | ORAL | 3 refills | Status: AC

## 2019-12-16 MED ORDER — AZITHROMYCIN 500 MG PO TABS: 500.0000 mg | ORAL_TABLET | Freq: Every day | ORAL | 0 refills | Status: AC

## 2019-12-16 MED ORDER — AZITHROMYCIN 250 MG PO TABS
500.0000 mg | ORAL_TABLET | Freq: Every day | ORAL | Status: DC
  Administered 2019-12-16: 500 mg via ORAL
  Filled 2019-12-16: qty 2

## 2019-12-16 MED ORDER — CEFPODOXIME PROXETIL 200 MG PO TABS: 200.0000 mg | ORAL_TABLET | Freq: Two times a day (BID) | ORAL | 0 refills | Status: DC

## 2019-12-16 MED ORDER — TRAMADOL HCL 50 MG PO TABS: 100.0000 mg | ORAL_TABLET | Freq: Two times a day (BID) | ORAL | 0 refills | Status: AC | PRN
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
## 2019-12-16 NOTE — Progress Notes (Signed)
Walker delivered to pt in room. SRP,RN
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
## 2019-12-16 NOTE — Progress Notes (Addendum)
Pt discharged to home, will discharge with a rolling walker, due to chronic back pain. Pt denies pain and SOB. Instructions reviewed with patient. Acknowledged understanding. SP, RN

## 2019-12-17 LAB — LEGIONELLA PNEUMOPHILA SEROGP 1 UR AG: L. pneumophila Serogp 1 Ur Ag: NEGATIVE

## 2019-12-20 LAB — CULTURE, BLOOD (ROUTINE X 2)
Culture: NO GROWTH
Culture: NO GROWTH
Special Requests: ADEQUATE
Special Requests: ADEQUATE

## 2020-01-24 ENCOUNTER — Other Ambulatory Visit: Payer: Self-pay

## 2020-01-24 ENCOUNTER — Ambulatory Visit: Payer: Medicaid Other | Admitting: Orthopedic Surgery

## 2020-01-24 DIAGNOSIS — M542 Cervicalgia: Secondary | ICD-10-CM

## 2020-01-24 DIAGNOSIS — M79601 Pain in right arm: Secondary | ICD-10-CM | POA: Diagnosis not present

## 2020-01-27 ENCOUNTER — Encounter: Payer: Self-pay | Admitting: Orthopedic Surgery

## 2020-01-27 NOTE — Progress Notes (Signed)
Office Visit Note   Patient: Shane Oliver           Date of Birth: 1961-03-30           MRN: [id number] Visit Date: 01/24/2020 Requested by: Jackie Plum, MD [id number],  [place] [address] PCP: Jackie Plum, MD  Subjective: Chief Complaint  Patient presents with  . Right Shoulder - Pain    HPI: Shane Oliver is a patient with right shoulder pain.  Reports increasing pain in the right shoulder with radicular arm pain going down the arm.  Describes occasional numbness and tingling in the arm.  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  Has a history of shingles affecting the left-hand side July 2020.  Right shoulder radiographs 01/06/2019 fairly unremarkable.              ROS: All systems reviewed are negative as they relate to the chief complaint within the history of present illness.  Patient denies  fevers or chills.   Assessment & Plan: Visit Diagnoses:  1. Pain of right upper extremity   2. 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  I think this is likely coming from his neck.  Shoulder unlikely culprit in this case.  Refer to Dr. Alvester Morin for cervical spine ESI follow-up with me as needed.  Follow-Up Instructions: No follow-ups on file.   Orders:  Orders Placed This Encounter  Procedures  . Ambulatory referral to Physical Medicine Rehab   No orders of the defined types were placed in this encounter.     Procedures: No procedures performed   Clinical Data: No additional findings.  Objective: Vital Signs: There were no vitals taken for this visit.  Physical Exam:   Constitutional: Patient appears well-developed HEENT:  Head: Normocephalic Eyes:EOM are normal Neck: Normal range of motion Cardiovascular: Normal rate Pulmonary/chest: Effort normal Neurologic:  Patient is alert Skin: Skin is warm Psychiatric: Patient has normal mood and affect    Ortho Exam: Ortho exam demonstrates fairly reasonable cervical spine range of motion with some reproduction of symptoms on the right-hand side with rotation to the right.  Has what appears to be chronic single rash on the left arm.  Patient has 5 out of 5 EPL FPL interosseous wrist flexion extension bicep triceps and deltoid strength.  Left arm generally is little bit more painful to move in the right arm.  Right arm does have some radicular symptoms and paresthesias in the C6 distribution.  Reflexes symmetric bilateral but biceps and triceps.  Radial pulse intact bilaterally.  Rotator cuff strength is good infraspinatus supraspinatus and subscap muscle testing with no restriction of external rotation 15 degrees of abduction right versus left.  Specialty Comments:  No specialty comments available.  Imaging: No results found.   PMFS History: Patient Active Problem List   Diagnosis Date Noted  . Pulmonary embolism (HCC) 12/14/2019  . Prediabetes 12/26/2012  . Elevated LDL cholesterol level 12/26/2012  . Hyperlipidemia LDL goal < 100 12/26/2012  . Chronic back pain 12/26/2012   Past Medical History:  Diagnosis Date  . Asthma   . Back pain, chronic   . Chronic back pain 12/26/2012  . Hyperlipidemia LDL goal < 100 12/26/2012    Family History  Problem Relation Age of Onset  . Diabetes Mother   . Stroke Father   .  Diabetes Father     History reviewed. No pertinent surgical history. Social History   Occupational History  . Not on file  Tobacco Use  . Smoking status: Never Smoker  . Smokeless tobacco: Never Used  Substance and Sexual Activity  . Alcohol use: No  . Drug use: No  . Sexual activity: Yes

## 2020-02-12 IMAGING — CR LUMBAR SPINE - COMPLETE 4+ VIEW
5 series · 5 of 5 positions shown · non-contrast
Comparison: Lumbar spine MR dated 01/17/2015.

CLINICAL DATA: Left low back pain after falling getting out of the
bathtub today.

EXAM:
LUMBAR SPINE - COMPLETE 4+ VIEW

[t lumbar spine ap]
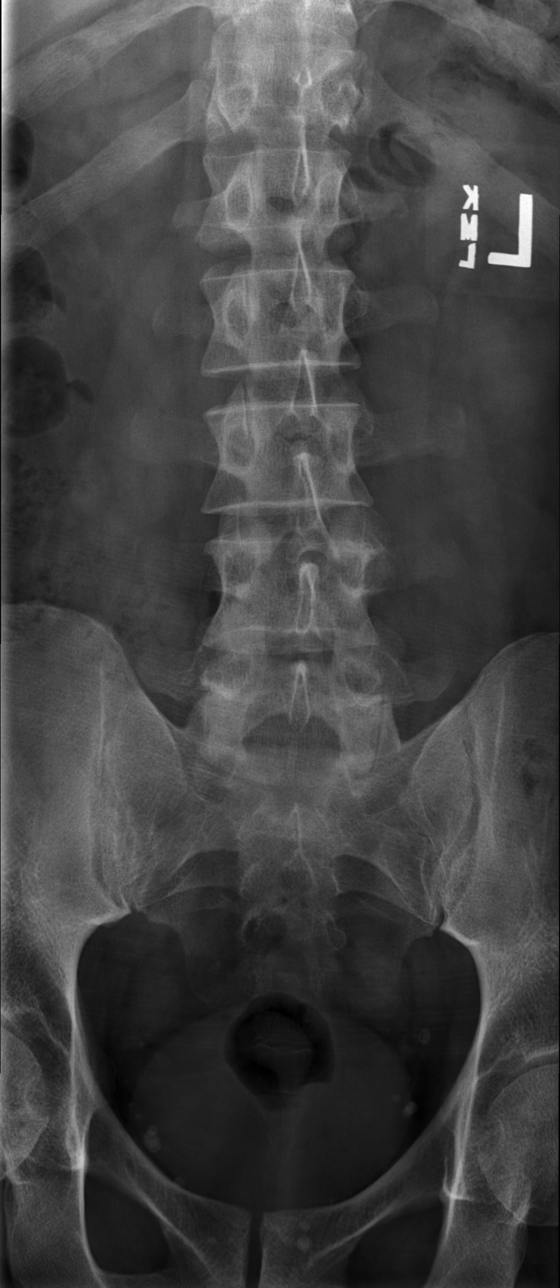

[t lumbar spine obl (1 of 2)]
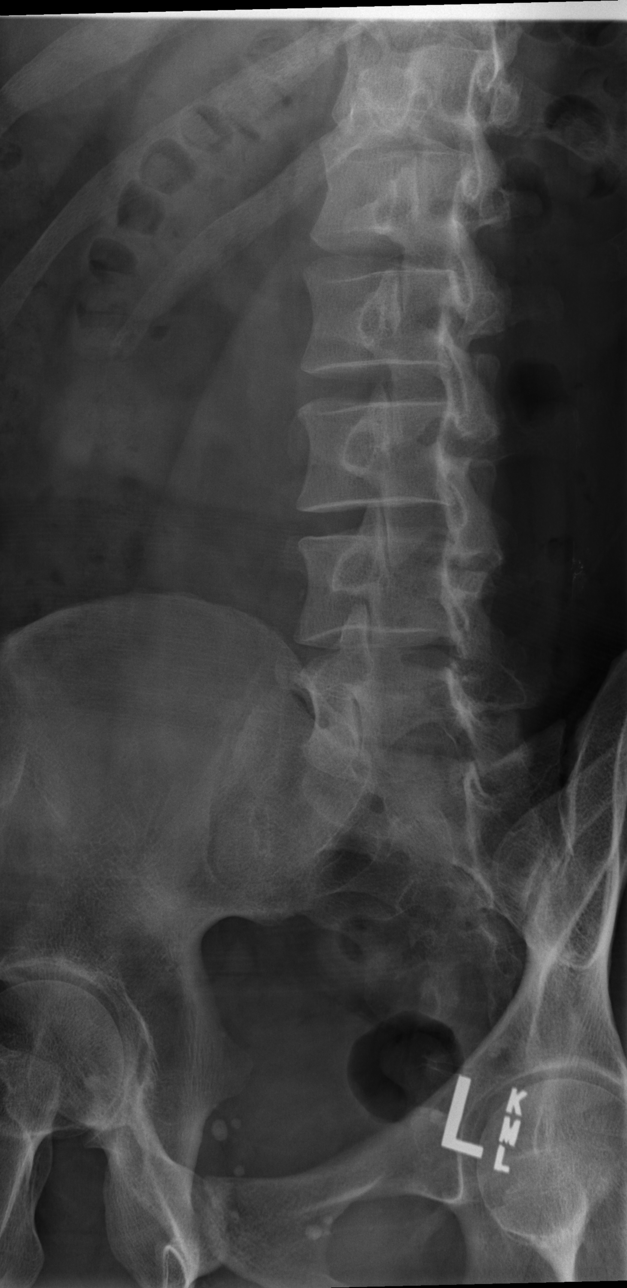

[t lumbar spine obl (2 of 2)]
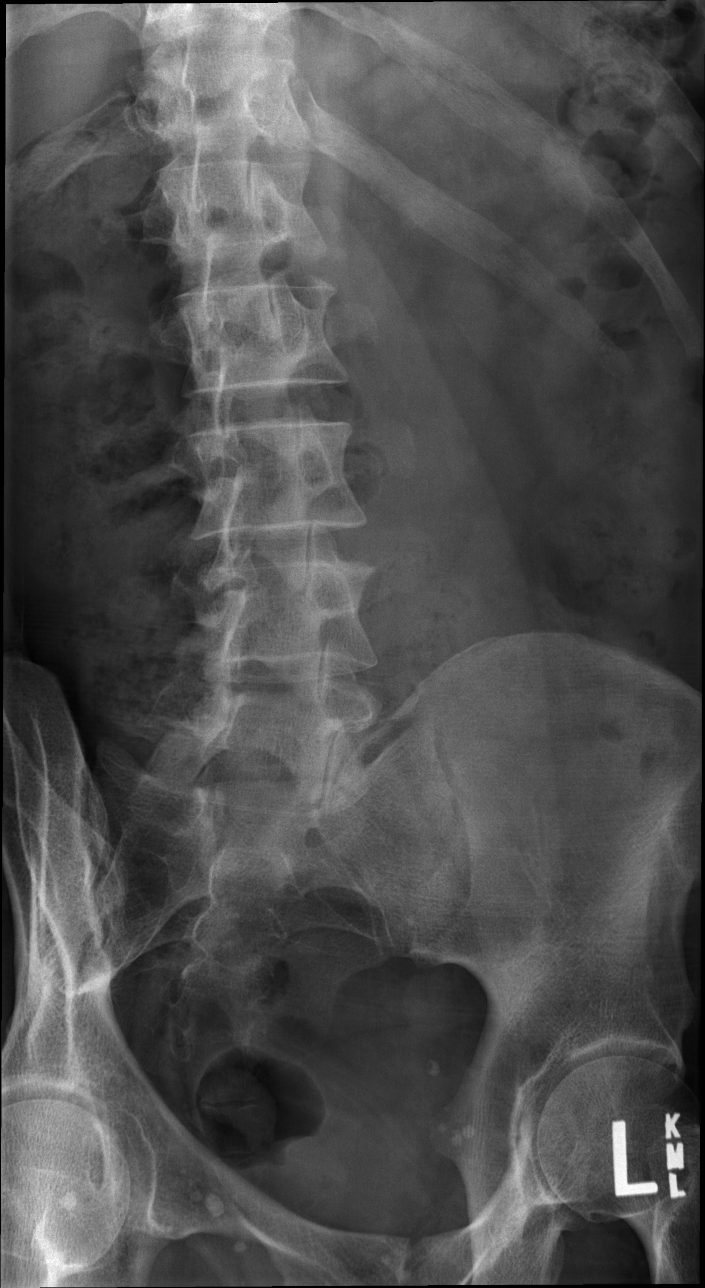

[t lumbar spine lat]
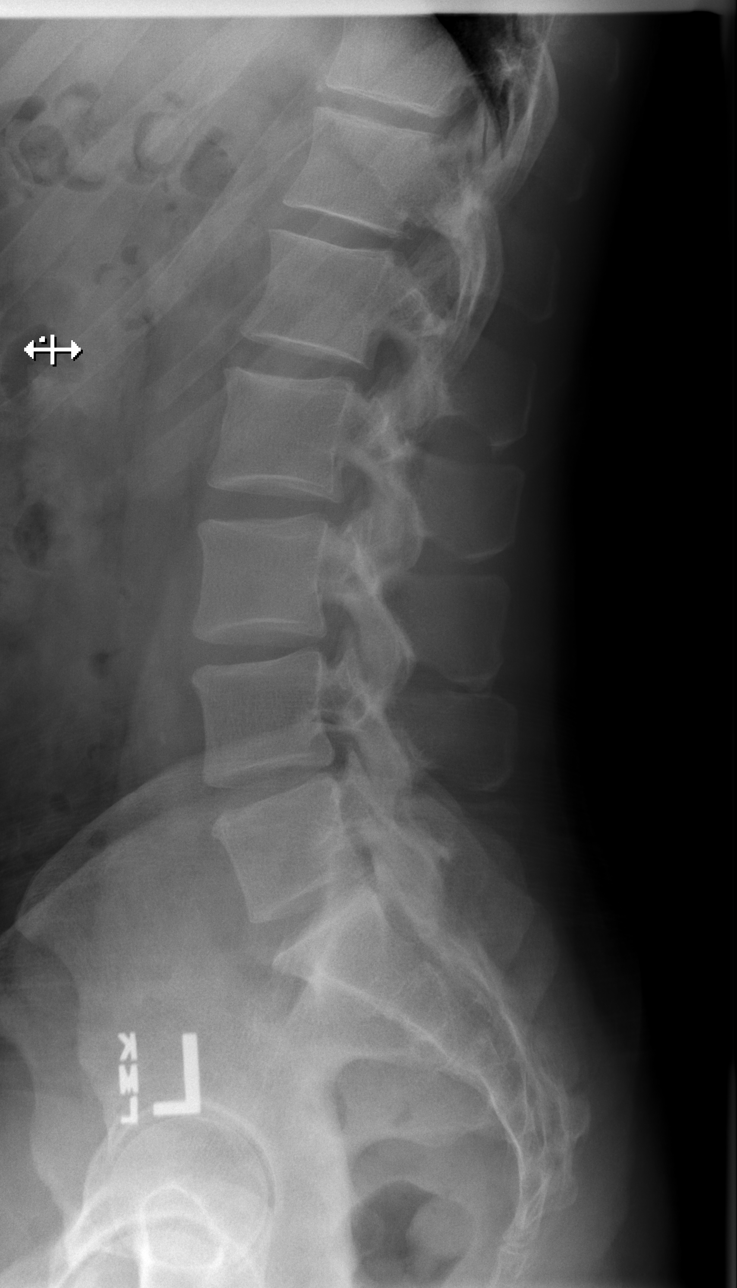

[t lumbar l-5 s-1 spot]
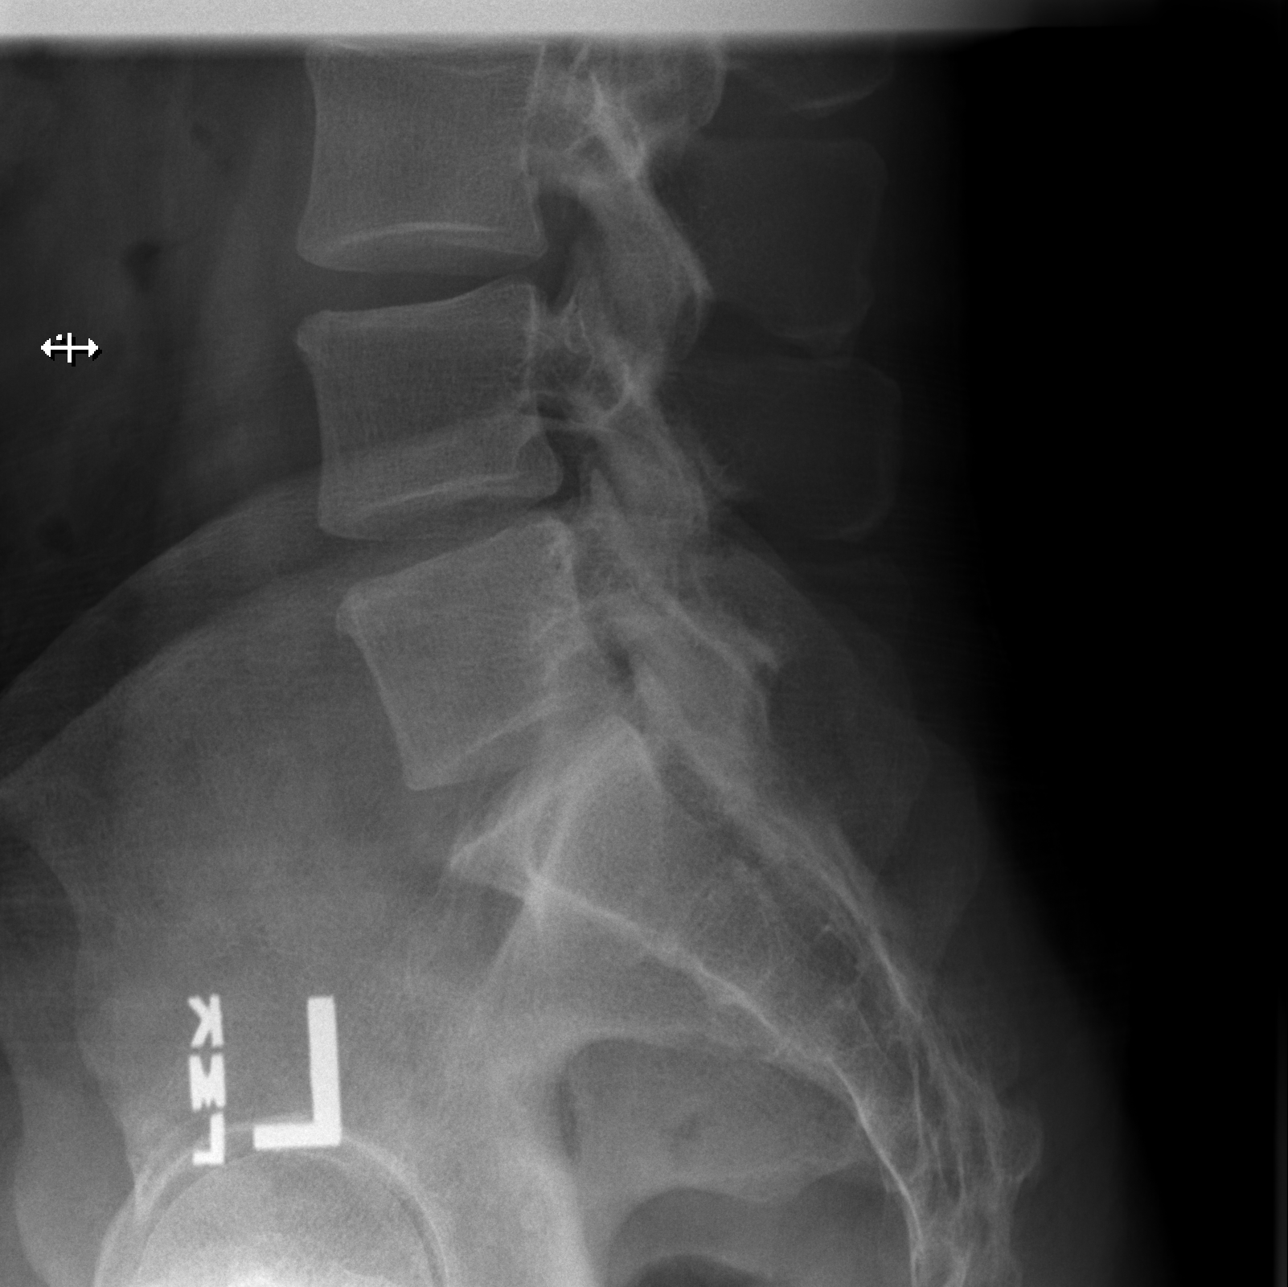

[5 of 5 positions shown; findings below may reference images not displayed]

FINDINGS: Five non-rib-bearing lumbar vertebrae. Minimal anterior spur
formation at the L1-2 and L4-5 levels and minimal lateral spur
formation at the L3-4 level. No fractures, pars defects or
subluxations.
IMPRESSION: No fracture or subluxation. Minimal degenerative changes.

## 2020-03-15 ENCOUNTER — Ambulatory Visit (INDEPENDENT_AMBULATORY_CARE_PROVIDER_SITE_OTHER): Payer: Medicaid Other | Admitting: Physical Medicine and Rehabilitation

## 2020-03-15 ENCOUNTER — Encounter: Payer: Self-pay | Admitting: Physical Medicine and Rehabilitation

## 2020-03-15 ENCOUNTER — Other Ambulatory Visit: Payer: Self-pay

## 2020-03-15 ENCOUNTER — Ambulatory Visit: Payer: Self-pay

## 2020-03-15 VITALS — BP 118/76 | HR 88

## 2020-03-15 DIAGNOSIS — M5412 Radiculopathy, cervical region: Secondary | ICD-10-CM

## 2020-03-15 MED ORDER — METHYLPREDNISOLONE ACETATE 80 MG/ML IJ SUSP
40.0000 mg | Freq: Once | INTRAMUSCULAR | Status: AC
  Administered 2020-03-15: 40 mg

## 2020-03-15 NOTE — Progress Notes (Signed)
Pt states pain in the neck and both shoulders especially the right shoulder. Pt states pain started years ago and has gotten worse in the past year. Pt states walking and turning head makes pain worse. Neck massager helps with pain.   .Numeric Pain Rating Scale and Functional Assessment Average Pain 7   In the last MONTH (on 0-10 scale) has pain interfered with the following?  1. General activity like being  able to carry out your everyday physical activities such as walking, climbing stairs, carrying groceries, or moving a chair?  Rating(7)   +Driver, +BT(Xarelto, held for 2 days), -Dye Allergies.

## 2020-03-17 NOTE — Procedures (Signed)
Cervical Epidural Steroid Injection - Interlaminar Approach with Fluoroscopic Guidance  Patient: Shane Oliver      Date of Birth: 22-May-1961 MRN: [id number] PCP: Jackie Plum, MD      Visit Date: 03/15/2020   Universal Protocol:    Date/Time: 05/14/215:34 AM  Consent Given By: the patient  Position: PRONE  Additional Comments: Vital signs were monitored before and after the procedure. Patient was prepped and draped in the usual sterile fashion. The correct patient, procedure, and site was verified.   Injection Procedure Details:  Procedure Site One Meds Administered:  Meds ordered this encounter  Medications  . methylPREDNISolone acetate (DEPO-MEDROL) injection 40 mg     Laterality: Right  Location/Site: C7-T1  Needle size: 20 G  Needle type: Touhy  Needle Placement: Paramedian epidural space  Findings:  -Comments: Excellent flow of contrast into the epidural space.  Procedure Details: Using a paramedian approach from the side mentioned above, the region overlying the inferior lamina was localized under fluoroscopic visualization and the soft tissues overlying this structure were infiltrated with 4 ml. of 1% Lidocaine without Epinephrine. A # 20 gauge, Tuohy needle was inserted into the epidural space using a paramedian approach.  The epidural space was localized using loss of resistance along with lateral and contralateral oblique bi-planar fluoroscopic views.  After negative aspirate for air, blood, and CSF, a 2 ml. volume of Isovue-250 was injected into the epidural space and the flow of contrast was observed. Radiographs were obtained for documentation purposes.   The injectate was administered into the level noted above.  Additional Comments:  The patient tolerated the procedure well Dressing: 2 x 2 sterile gauze and Band-Aid    Post-procedure details: Patient was observed during the procedure. Post-procedure instructions were  reviewed.  Patient left the clinic in stable condition.

## 2020-03-17 NOTE — Progress Notes (Signed)
Shane Oliver - 59 y.o. male MRN [id number]  Date of birth: 09/12/1961  Office Visit Note: Visit Date: 03/15/2020 PCP: Jackie Plum, MD Referred by: Jackie Plum, MD  Subjective: Chief Complaint  Patient presents with  . Neck - Pain  . Right Shoulder - Pain  . Left Shoulder - Pain   HPI:  Shane Oliver is a 59 y.o. male who comes in today At the request of Dr. Burnard Bunting for planned Right C7-T1 cervical epidural steroid injection with fluoroscopic guidance.  The patient has failed conservative care including home exercise, medications, time and activity modification.  This injection will be diagnostic and hopefully therapeutic.  Please see requesting physician notes for further details and justification.   ROS Otherwise per HPI.  Assessment & Plan: Visit Diagnoses:  1. Cervical radiculopathy     Plan: No additional findings.   Meds & Orders:  Meds ordered this encounter  Medications  . methylPREDNISolone acetate (DEPO-MEDROL) injection 40 mg    Orders Placed This Encounter  Procedures  . XR C-ARM NO REPORT  . Epidural Steroid injection    Follow-up: Return if symptoms worsen or fail to improve.   Procedures: No procedures performed  Cervical Epidural Steroid Injection - Interlaminar Approach with Fluoroscopic Guidance  Patient: Shane Oliver      Date of Birth: [birth year] MRN: [id number] PCP: Jackie Plum, MD      Visit Date: 03/15/2020   Universal Protocol:    Date/Time: 05/14/215:34 AM  Consent Given By: the patient  Position: PRONE  Additional Comments: Vital signs were monitored before and after the procedure. Patient was prepped and draped in the usual sterile fashion. The correct patient, procedure, and site was verified.   Injection Procedure Details:  Procedure Site One Meds Administered:  Meds ordered this encounter  Medications  . methylPREDNISolone acetate (DEPO-MEDROL) injection 40 mg     Laterality:  Right  Location/Site: C7-T1  Needle size: 20 G  Needle type: Touhy  Needle Placement: Paramedian epidural space  Findings:  -Comments: Excellent flow of contrast into the epidural space.  Procedure Details: Using a paramedian approach from the side mentioned above, the region overlying the inferior lamina was localized under fluoroscopic visualization and the soft tissues overlying this structure were infiltrated with 4 ml. of 1% Lidocaine without Epinephrine. A # 20 gauge, Tuohy needle was inserted into the epidural space using a paramedian approach.  The epidural space was localized using loss of resistance along with lateral and contralateral oblique bi-planar fluoroscopic views.  After negative aspirate for air, blood, and CSF, a 2 ml. volume of Isovue-250 was injected into the epidural space and the flow of contrast was observed. Radiographs were obtained for documentation purposes.   The injectate was administered into the level noted above.  Additional Comments:  The patient tolerated the procedure well Dressing: 2 x 2 sterile gauze and Band-Aid    Post-procedure details: Patient was observed during the procedure. Post-procedure instructions were reviewed.  Patient left the clinic in stable condition.     Clinical History: CLINICAL DATA:  Chronic neck and upper thoracic spine pain. Worsened symptoms after a fall 1 month ago with radicular pain, numbness, and weakness in the right arm as well as pain radiating into the left scapula.  EXAM: MRI CERVICAL SPINE WITHOUT CONTRAST  TECHNIQUE: Multiplanar, multisequence MR imaging of the cervical spine was performed. No intravenous contrast was administered.  COMPARISON:  Cervical spine radiographs 01/06/2019  FINDINGS: Alignment: Normal.  Vertebrae: No fracture or  suspicious osseous lesion. Mild degenerative endplate edema at X3-8.  Cord: Normal signal morphology.  Posterior Fossa, vertebral arteries,  paraspinal tissues: Unremarkable.  Disc levels:  C2-3: Mild right uncovertebral spurring without significant stenosis.  C3-4: Mild disc bulging and right uncovertebral spurring result in mild-to-moderate right neural foraminal stenosis without spinal stenosis.  C4-5: Mild disc bulging and uncovertebral spurring result in mild bilateral neural foraminal stenosis without spinal stenosis.  C5-6: Disc bulging and uncovertebral spurring result in moderate bilateral neural foraminal stenosis with potential bilateral C6 nerve root impingement. No spinal stenosis.  C6-7: Mild disc bulging and uncovertebral spurring result in mild to moderate bilateral neural foraminal stenosis without spinal stenosis.  C7-T1: Minimal disc bulging, minimal endplate spurring, and mild facet arthrosis without significant stenosis.  IMPRESSION: 1. Mild cervical disc degeneration without significant spinal stenosis. 2. Multilevel neural foraminal stenosis, moderate bilaterally at C5-6.   Electronically Signed   By: Logan Bores M.D.   On: 03/25/2019 10:39     Objective:  VS:  HT:    WT:   BMI:     BP:118/76  HR:88bpm  TEMP: ( )  RESP:  Physical Exam Vitals and nursing note reviewed.  Constitutional:      General: He is not in acute distress.    Appearance: Normal appearance. He is not ill-appearing.  HENT:     Head: Normocephalic and atraumatic.     Right Ear: External ear normal.     Left Ear: External ear normal.  Eyes:     Extraocular Movements: Extraocular movements intact.  Cardiovascular:     Rate and Rhythm: Normal rate.     Pulses: Normal pulses.  Abdominal:     General: There is no distension.     Palpations: Abdomen is soft.  Musculoskeletal:        General: No signs of injury.     Cervical back: Neck supple. Tenderness present. No rigidity.     Right lower leg: No edema.     Left lower leg: No edema.     Comments: Patient has good strength in the upper  extremities with 5 out of 5 strength in wrist extension long finger flexion APB.  No intrinsic hand muscle atrophy.  Negative Hoffmann's test.  Lymphadenopathy:     Cervical: No cervical adenopathy.  Skin:    Findings: No erythema or rash.  Neurological:     General: No focal deficit present.     Mental Status: He is alert and oriented to person, place, and time.     Sensory: No sensory deficit.     Motor: No weakness or abnormal muscle tone.     Coordination: Coordination normal.  Psychiatric:        Mood and Affect: Mood normal.        Behavior: Behavior normal.      Imaging: No results found.

## 2020-08-16 ENCOUNTER — Other Ambulatory Visit: Payer: Self-pay

## 2020-08-16 ENCOUNTER — Encounter: Payer: Self-pay | Admitting: Physical Therapy

## 2020-08-16 ENCOUNTER — Ambulatory Visit: Payer: Medicaid Other | Attending: Physician Assistant | Admitting: Physical Therapy

## 2020-08-16 DIAGNOSIS — R293 Abnormal posture: Secondary | ICD-10-CM | POA: Insufficient documentation

## 2020-08-16 DIAGNOSIS — M545 Low back pain, unspecified: Secondary | ICD-10-CM

## 2020-08-16 DIAGNOSIS — G8929 Other chronic pain: Secondary | ICD-10-CM | POA: Diagnosis present

## 2020-08-16 DIAGNOSIS — M6281 Muscle weakness (generalized): Secondary | ICD-10-CM | POA: Insufficient documentation
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
## 2020-08-17 ENCOUNTER — Ambulatory Visit: Payer: Medicaid Other | Admitting: Physical Therapy

## 2020-08-31 ENCOUNTER — Other Ambulatory Visit: Payer: Self-pay

## 2020-08-31 ENCOUNTER — Ambulatory Visit: Payer: Medicaid Other

## 2020-08-31 DIAGNOSIS — G8929 Other chronic pain: Secondary | ICD-10-CM

## 2020-08-31 DIAGNOSIS — M6281 Muscle weakness (generalized): Secondary | ICD-10-CM

## 2020-08-31 DIAGNOSIS — M545 Low back pain, unspecified: Secondary | ICD-10-CM | POA: Diagnosis not present

## 2020-08-31 DIAGNOSIS — R293 Abnormal posture: Secondary | ICD-10-CM

## 2020-08-31 NOTE — Therapy (Signed)
[facility] Outpatient Rehabilitation [facility] [address] [place], [place], [address] Phone: [phone]   Fax:  [phone]  Physical Therapy Treatment  Patient Details  Name: Shane Oliver MRN: [id number] Date of Birth: 02-03-1961 Referring Provider (PT): Norva Riffle, [place]   Encounter Date: 08/31/2020   PT End of Session - 08/31/20 1134    Visit Number 2    Number of Visits 17    Date for PT Re-Evaluation 10/14/20    Authorization Type [place] Medicaid healthy blue- 27 visits/year, auth submitted 10/13    PT Start Time 1130    PT Stop Time 1210    PT Time Calculation (min) 40 min    Activity Tolerance Patient tolerated treatment well    Behavior During Therapy [facility] for tasks assessed/performed           Past Medical History:  Diagnosis Date  . Asthma   . Back pain, chronic   . Chronic back pain 12/26/2012  . Hyperlipidemia LDL goal < 100 12/26/2012    History reviewed. No pertinent surgical history.  There were no vitals filed for this visit.   Subjective Assessment - 08/31/20 1147    Subjective Pt states "not too good" when asked how he is feeling upon arrival. Pt reports increased cervical/upper thoracic pain and continued low back pain that is greater on L side.    How long can you walk comfortably? 2-3 min    Patient Stated Goals play with grand kids- ill be there over the holidays & summer    Currently in Pain? Yes    Pain Score 9     Pain Location Back    Pain Orientation Left;Lower    Pain Descriptors / Indicators Aching;Sore    Pain Type Chronic pain    Pain Onset More than a month ago    Multiple Pain Sites Yes    Pain Score 9    Pain Location Neck    Pain Orientation Left    Pain Descriptors / Indicators Aching;Sore    Pain Type Chronic pain    Pain Onset More than a month ago              [facility] PT Assessment - 08/31/20 0001      Assessment   Medical Diagnosis LBP    Referring Provider (PT) Norva Riffle, PA                          [facility] Adult PT Treatment/Exercise - 08/31/20 0001      Self-Care   Self-Care Other Self-Care Comments    Other Self-Care Comments  HEP, postural control, optimal technique/form during interventions. Addressed pt's questions about where to purchase physioball and provided pt with a handout.      Exercises   Exercises Lumbar      Lumbar Exercises: Stretches   Active Hamstring Stretch Left;2 reps;30 seconds    Other Lumbar Stretch Exercise Seated QL and lumbar stretch using green physioball - forward and to each side 5x each direction      Lumbar Exercises: Aerobic   Nustep L4 2.5 min then L2 for 2.5 min due to increased fatigue/difficulty using BLE/BUE      Lumbar Exercises: Standing   Functional Squats 15 reps    Functional Squats Limitations BUE support at counter; cues for form    Other Standing Lumbar Exercises Dead lifts with 15# kettle bell on 12 inch step 10x; cues to keep B shoulders down/back  and maintain neutral lumbar spine while performing hip hinge      Lumbar Exercises: Supine   Pelvic Tilt 15 reps    Pelvic Tilt Limitations cues for form      Lumbar Exercises: Sidelying   Other Sidelying Lumbar Exercises Open book 10x each side with 5 sec hold at end range      Manual Therapy   Manual Therapy Soft tissue mobilization;Myofascial release    Soft tissue mobilization STM and myofascial release along L quadratus lumborum and B paraspinals                  PT Education - 08/31/20 1149    Education Details HEP, postural control, optimal technique/form during interventions. Addressed pt's questions about where to purchase physioball and provided pt with a handout.    Person(s) Educated Patient    Methods Explanation;Demonstration;Tactile cues;Verbal cues    Comprehension Verbalized understanding;Returned demonstration;Verbal cues required;Tactile cues required            PT Short Term Goals - 08/16/20 1247      PT SHORT  TERM GOAL #1   Title pt will verbalize daily performance of HEP exericses/stretches    Baseline began educating at eval    Time 3    Period Weeks    Status New    Target Date 09/09/20      PT SHORT TERM GOAL #2   Title pt will demo proper resting posture at least 50% of the time without cues    Baseline poor resting posture at eval    Time 3    Period Weeks    Status New    Target Date 09/09/20             PT Long Term Goals - 08/16/20 1248      PT LONG TERM GOAL #1   Title Bil HS flexibility to at least 70 deg    Baseline 40 bil at eval    Time 8    Period Weeks    Status New    Target Date 10/14/20      PT LONG TERM GOAL #2   Title gross hip strength to 5/5    Baseline see flowsheet    Time 8    Period Weeks    Status New    Target Date 10/14/20      PT LONG TERM GOAL #3   Title pt will feel ready to participate in play with grand kids in time for holiday visit    Baseline unable at eval due to pain    Time 8    Period Weeks    Status New    Target Date 10/14/20      PT LONG TERM GOAL #4   Title average pain with ADLs <=4/10    Baseline 6/10 at rest at eval with meds in system    Time 8    Period Weeks    Status New    Target Date 10/14/20      PT LONG TERM GOAL #5   Title pt will be independent in long term HEP for continued strengthening    Baseline will progress and establish as appropriate    Time 8    Period Weeks    Status New    Target Date 10/14/20                 Plan - 08/31/20 1225    Clinical Impression Statement Pt demonstrates significant tightness and  TTP along L quadratus lumborum and tightness in B HS during seated stretch. Pt was able to perform interventions with good form following initial visual, verbal, and tactile cues. Pt will benefit from continued postural control, stretching, and strengthening.    Personal Factors and Comorbidities Time since onset of injury/illness/exacerbation;Fitness    Examination-Activity  Limitations Bed Mobility;Sit;Bend;Sleep;Squat;Carry;Stairs;Stand;Lift;Locomotion Level    Examination-Participation Restrictions Driving;Community Activity    Stability/Clinical Decision Making Stable/Uncomplicated    Rehab Potential Good    PT Frequency 2x / week    PT Duration 8 weeks    PT Treatment/Interventions ADLs/Self Care Home Management;Cryotherapy;Electrical Stimulation;Moist Heat;Stair training;Functional mobility training;Therapeutic activities;Therapeutic exercise;Neuromuscular re-education;Manual techniques;Patient/family education;Passive range of motion;Dry needling;Taping;Joint Manipulations;Spinal Manipulations    PT Next Visit Plan Review HEP, continue with lumbar stretches (forward flexion, open book, lower trunk rotation), core strengthening, and postural control exercises    PT Home Exercise Plan [id number]    Consulted and Agree with Plan of Care Patient           Patient will benefit from skilled therapeutic intervention in order to improve the following deficits and impairments:  Decreased range of motion, Difficulty walking, Increased muscle spasms, Decreased activity tolerance, Pain, Improper body mechanics, Impaired flexibility, Decreased strength, Postural dysfunction  Visit Diagnosis: Chronic left-sided low back pain without sciatica  Muscle weakness (generalized)  Abnormal posture     Problem List Patient Active Problem List   Diagnosis Date Noted  . Pulmonary embolism (HCC) 12/14/2019  . Prediabetes 12/26/2012  . Elevated LDL cholesterol level 12/26/2012  . Hyperlipidemia LDL goal < 100 12/26/2012  . Chronic back pain 12/26/2012     Rhea Bleacher, PT, DPT 08/31/20 12:37 PM  [facility] Outpatient Rehabilitation [facility] [address] [place], [place], [address] Phone: [phone]   Fax:  [phone]  Name: Yassen Kinnett MRN: [id number] Date of Birth: 1961-06-04

## 2020-09-04 ENCOUNTER — Ambulatory Visit: Payer: Medicaid Other | Attending: Physician Assistant

## 2020-09-04 ENCOUNTER — Telehealth: Payer: Self-pay

## 2020-09-04 DIAGNOSIS — M545 Low back pain, unspecified: Secondary | ICD-10-CM | POA: Insufficient documentation

## 2020-09-04 DIAGNOSIS — M6281 Muscle weakness (generalized): Secondary | ICD-10-CM | POA: Insufficient documentation

## 2020-09-04 DIAGNOSIS — R293 Abnormal posture: Secondary | ICD-10-CM | POA: Insufficient documentation

## 2020-09-04 DIAGNOSIS — G8929 Other chronic pain: Secondary | ICD-10-CM | POA: Insufficient documentation

## 2020-09-04 NOTE — Telephone Encounter (Signed)
PT called and spoke with Mr. Griffee regarding his "no show" for today's 11:30 AM appointment. Patient did not realize his appointment was today due to most appointments being Tuesday/Thursday. Pt was apologetic and states he will be at his Thursday appointment this week.  Rhea Bleacher, PT, DPT 09/04/20 5:10 PM

## 2020-09-07 ENCOUNTER — Ambulatory Visit: Payer: Medicaid Other

## 2020-09-07 ENCOUNTER — Other Ambulatory Visit: Payer: Self-pay

## 2020-09-07 DIAGNOSIS — R293 Abnormal posture: Secondary | ICD-10-CM

## 2020-09-07 DIAGNOSIS — G8929 Other chronic pain: Secondary | ICD-10-CM

## 2020-09-07 DIAGNOSIS — M545 Low back pain, unspecified: Secondary | ICD-10-CM | POA: Diagnosis present

## 2020-09-07 DIAGNOSIS — M6281 Muscle weakness (generalized): Secondary | ICD-10-CM

## 2020-09-07 NOTE — Therapy (Signed)
[facility] Outpatient Rehabilitation [facility] [address] [place], [place], [address] Phone: [phone]   Fax:  [phone]  Physical Therapy Treatment  Patient Details  Name: Shane Oliver MRN: [id number] Date of Birth: 05/29/1961 Referring Provider (PT): Norva Riffle, [place]   Encounter Date: 09/07/2020   PT End of Session - 09/07/20 1212    Visit Number 3    Number of Visits 17    Date for PT Re-Evaluation 10/14/20    Authorization Type [place] Medicaid healthy blue- 27 visits/year, auth submitted 10/13    PT Start Time 1130    PT Stop Time 1211    PT Time Calculation (min) 41 min    Activity Tolerance Patient tolerated treatment well    Behavior During Therapy [facility] for tasks assessed/performed           Past Medical History:  Diagnosis Date  . Asthma   . Back pain, chronic   . Chronic back pain 12/26/2012  . Hyperlipidemia LDL goal < 100 12/26/2012    History reviewed. No pertinent surgical history.  There were no vitals filed for this visit.   Subjective Assessment - 09/07/20 1136    Subjective Pt states "pretty good today, actually. I have been doing my exercises at home. When I first started, they would hurt a lot, but now it feels better. I feel like I have a pinched nerve up here in my neck" while placing finger at C7-T1.    How long can you walk comfortably? 2-3 min    Patient Stated Goals play with grand kids- ill be there over the holidays & summer    Currently in Pain? Yes    Pain Score 6     Pain Location Back    Pain Orientation Lower;Left    Pain Descriptors / Indicators Aching;Sore    Pain Type Chronic pain    Pain Onset More than a month ago    Pain Score 8    Pain Location Neck    Pain Orientation Left    Pain Descriptors / Indicators Aching;Sore    Pain Type Chronic pain    Pain Onset More than a month ago              [facility] PT Assessment - 09/07/20 0001      Assessment   Medical Diagnosis LBP    Referring Provider  (PT) Norva Riffle, PA                         [facility] Adult PT Treatment/Exercise - 09/07/20 0001      Self-Care   Self-Care Other Self-Care Comments    Other Self-Care Comments  Continued consistency with HEP and postural control to help with his back pain.      Exercises   Exercises Neck      Neck Exercises: Standing   Neck Retraction 5 reps    Neck Retraction Limitations Against ball to demonstrate different position for cervical retraction      Neck Exercises: Supine   Neck Retraction 15 reps    Neck Retraction Limitations Against pillow; cues for form      Lumbar Exercises: Stretches   Active Hamstring Stretch Left;5 reps;10 seconds    Other Lumbar Stretch Exercise Seated QL and lumbar stretch using green physioball - forward and to each side 5x each direction      Lumbar Exercises: Aerobic   Nustep L2 x 5 min      Lumbar  Exercises: Standing   Functional Squats 20 reps    Functional Squats Limitations BUE support at [employer] Strengthening;Both;15 reps;Theraband    Theraband Level (Row) Level 4 (Blue)    Other Standing Lumbar Exercises Scapular protraction (push up plus) against wall x 15      Lumbar Exercises: Supine   Pelvic Tilt 20 reps    Pelvic Tilt Limitations cues for form    Bridge with Ball Squeeze 10 reps    Other Supine Lumbar Exercises LTR 10x to each side      Lumbar Exercises: Sidelying   Other Sidelying Lumbar Exercises --      Manual Therapy   Manual Therapy Soft tissue mobilization;Myofascial release;Passive ROM    Manual therapy comments STM and myofascial release along B upper traps and levator scap    Passive ROM Passive L upper trap stretch 2 x 30 sec                  PT Education - 09/07/20 1219    Education Details Continued consistency with HEP and postural control to help with his back pain.    Person(s) Educated Patient    Methods Explanation;Demonstration    Comprehension Verbalized  understanding;Returned demonstration            PT Short Term Goals - 08/16/20 1247      PT SHORT TERM GOAL #1   Title pt will verbalize daily performance of HEP exericses/stretches    Baseline began educating at eval    Time 3    Period Weeks    Status New    Target Date 09/09/20      PT SHORT TERM GOAL #2   Title pt will demo proper resting posture at least 50% of the time without cues    Baseline poor resting posture at eval    Time 3    Period Weeks    Status New    Target Date 09/09/20             PT Long Term Goals - 08/16/20 1248      PT LONG TERM GOAL #1   Title Bil HS flexibility to at least 70 deg    Baseline 40 bil at eval    Time 8    Period Weeks    Status New    Target Date 10/14/20      PT LONG TERM GOAL #2   Title gross hip strength to 5/5    Baseline see flowsheet    Time 8    Period Weeks    Status New    Target Date 10/14/20      PT LONG TERM GOAL #3   Title pt will feel ready to participate in play with grand kids in time for holiday visit    Baseline unable at eval due to pain    Time 8    Period Weeks    Status New    Target Date 10/14/20      PT LONG TERM GOAL #4   Title average pain with ADLs <=4/10    Baseline 6/10 at rest at eval with meds in system    Time 8    Period Weeks    Status New    Target Date 10/14/20      PT LONG TERM GOAL #5   Title pt will be independent in long term HEP for continued strengthening    Baseline will progress and establish as appropriate  Time 8    Period Weeks    Status New    Target Date 10/14/20                 Plan - 09/07/20 1218    Clinical Impression Statement Pt tolerated treatment session well with some soreness but no significant increase in pain. Pt was able to tolerate increased repetitions of interventions with minor fatigue during scapular protraction. Pt will benefit from progression of exercises next session.    Personal Factors and Comorbidities Time since onset  of injury/illness/exacerbation;Fitness    Examination-Activity Limitations Bed Mobility;Sit;Bend;Sleep;Squat;Carry;Stairs;Stand;Lift;Locomotion Level    Examination-Participation Restrictions Driving;Community Activity    Stability/Clinical Decision Making Stable/Uncomplicated    Rehab Potential Good    PT Frequency 2x / week    PT Duration 8 weeks    PT Treatment/Interventions ADLs/Self Care Home Management;Cryotherapy;Electrical Stimulation;Moist Heat;Stair training;Functional mobility training;Therapeutic activities;Therapeutic exercise;Neuromuscular re-education;Manual techniques;Patient/family education;Passive range of motion;Dry needling;Taping;Joint Manipulations;Spinal Manipulations    PT Next Visit Plan Review HEP, continue with lumbar stretches (forward flexion, open book, lower trunk rotation), core strengthening (progress from PPT to dead bugs - start with only LE if needed), pallof press, dead lifts, hip hinge, doorway pec stretch, serratus punches with wand and weight    PT Home Exercise Plan [id number]    Consulted and Agree with Plan of Care Patient           Patient will benefit from skilled therapeutic intervention in order to improve the following deficits and impairments:  Decreased range of motion, Difficulty walking, Increased muscle spasms, Decreased activity tolerance, Pain, Improper body mechanics, Impaired flexibility, Decreased strength, Postural dysfunction  Visit Diagnosis: Chronic left-sided low back pain without sciatica  Muscle weakness (generalized)  Abnormal posture     Problem List Patient Active Problem List   Diagnosis Date Noted  . Pulmonary embolism (HCC) 12/14/2019  . Prediabetes 12/26/2012  . Elevated LDL cholesterol level 12/26/2012  . Hyperlipidemia LDL goal < 100 12/26/2012  . Chronic back pain 12/26/2012     Rhea Bleacher, PT, DPT 09/07/20 12:32 PM  [facility] Outpatient Rehabilitation [facility] [address] [place], [place], [address] Phone: [phone]   Fax:  [phone]  Name: Shane Oliver MRN: [id number] Date of Birth: Jul 09, 1961

## 2020-09-12 ENCOUNTER — Ambulatory Visit: Payer: Medicaid Other

## 2020-09-12 ENCOUNTER — Other Ambulatory Visit: Payer: Self-pay

## 2020-09-12 DIAGNOSIS — R293 Abnormal posture: Secondary | ICD-10-CM

## 2020-09-12 DIAGNOSIS — M6281 Muscle weakness (generalized): Secondary | ICD-10-CM

## 2020-09-12 DIAGNOSIS — G8929 Other chronic pain: Secondary | ICD-10-CM

## 2020-09-12 DIAGNOSIS — M545 Low back pain, unspecified: Secondary | ICD-10-CM | POA: Diagnosis not present

## 2020-09-12 NOTE — Therapy (Signed)
[facility] Outpatient Rehabilitation [facility] [address] [place], [place], [address] Phone: [phone]   Fax:  [phone]  Physical Therapy Treatment  Patient Details  Name: Shane Oliver MRN: [id number] Date of Birth: 15-Nov-1960 Referring Provider (PT): Norva Riffle, [place]   Encounter Date: 09/12/2020   PT End of Session - 09/12/20 1141    Visit Number 4    Number of Visits 17    Date for PT Re-Evaluation 10/14/20    Authorization Type [place] Medicaid healthy blue- 27 visits/year, auth submitted 10/13    PT Start Time 1137   pt arrived late   PT Stop Time 1215    PT Time Calculation (min) 38 min    Activity Tolerance Patient tolerated treatment well    Behavior During Therapy [facility] for tasks assessed/performed           Past Medical History:  Diagnosis Date  . Asthma   . Back pain, chronic   . Chronic back pain 12/26/2012  . Hyperlipidemia LDL goal < 100 12/26/2012    History reviewed. No pertinent surgical history.  There were no vitals filed for this visit.   Subjective Assessment - 09/12/20 1144    Subjective Pt states "not bad" when asked about his pain today. Pt reports pain 6.5/10 in his lower back today but states he has been able to tolerate HEP much better than when he first began PT.    How long can you walk comfortably? 2-3 min    Patient Stated Goals play with grand kids- ill be there over the holidays & summer    Currently in Pain? Yes    Pain Score 6     Pain Location Back    Pain Orientation Lower;Left    Pain Descriptors / Indicators Aching;Sore    Pain Type Chronic pain    Pain Onset More than a month ago    Pain Onset More than a month ago              [facility] PT Assessment - 09/12/20 0001      Assessment   Medical Diagnosis LBP    Referring Provider (PT) Norva Riffle, PA                         [facility] Adult PT Treatment/Exercise - 09/12/20 0001      Self-Care   Self-Care Other Self-Care Comments     Other Self-Care Comments  HEP and continued use of icy hot, heat pack, or other modalities for pain relief at home as needed      Neck Exercises: Prone   Rows --    Rows Limitations --      Lumbar Exercises: Stretches   Active Hamstring Stretch Right;Left;4 reps;20 seconds    Active Hamstring Stretch Limitations Seated    Standing Side Bend Right;4 reps;20 seconds    Standing Side Bend Limitations R SB to stretch L side - standing and bending over red physioball (ball on mat) with LUE abduction    Other Lumbar Stretch Exercise Standing lumbar stretch at counter (forward flexion)      Lumbar Exercises: Aerobic   Nustep L3 x 6 min      Lumbar Exercises: Standing   Functional Squats 10 reps    Functional Squats Limitations BUE support at [employer] Strengthening;Both;20 reps;Theraband    Theraband Level (Row) Level 4 (Blue)    Row Limitations TrA activation during rows    Other  Standing Lumbar Exercises Pallof press with rotation using 1 blue theraband 10x each direction; cues for form    Other Standing Lumbar Exercises Standing hip ABD with 2# ankle weights 10x each LE; standing hip EXT 10x each LE      Manual Therapy   Manual Therapy Soft tissue mobilization;Joint mobilization;Myofascial release    Joint Mobilization Gentle PA joint mobilizations grades I-IV to tolerance along lumbar spine    Soft tissue mobilization STM and myofascial release along L quadratus lumborum and B paraspinals                  PT Education - 09/12/20 1324    Education Details HEP and continued use of icy hot, heat pack, or other modalities for pain relief at home as needed.    Person(s) Educated Patient    Methods Explanation;Demonstration    Comprehension Verbalized understanding;Returned demonstration            PT Short Term Goals - 09/12/20 1326      PT SHORT TERM GOAL #1   Title pt will verbalize daily performance of HEP exericses/stretches    Baseline began educating at  eval    Time 3    Period Weeks    Status Achieved    Target Date 09/09/20      PT SHORT TERM GOAL #2   Title pt will demo proper resting posture at least 50% of the time without cues    Baseline poor resting posture at eval    Time 3    Period Weeks    Status New    Target Date 09/09/20             PT Long Term Goals - 08/16/20 1248      PT LONG TERM GOAL #1   Title Bil HS flexibility to at least 70 deg    Baseline 40 bil at eval    Time 8    Period Weeks    Status New    Target Date 10/14/20      PT LONG TERM GOAL #2   Title gross hip strength to 5/5    Baseline see flowsheet    Time 8    Period Weeks    Status New    Target Date 10/14/20      PT LONG TERM GOAL #3   Title pt will feel ready to participate in play with grand kids in time for holiday visit    Baseline unable at eval due to pain    Time 8    Period Weeks    Status New    Target Date 10/14/20      PT LONG TERM GOAL #4   Title average pain with ADLs <=4/10    Baseline 6/10 at rest at eval with meds in system    Time 8    Period Weeks    Status New    Target Date 10/14/20      PT LONG TERM GOAL #5   Title pt will be independent in long term HEP for continued strengthening    Baseline will progress and establish as appropriate    Time 8    Period Weeks    Status New    Target Date 10/14/20                 Plan - 09/12/20 1142    Clinical Impression Statement Pt tolerated treatment session with no complaints of significant increase in pain but had  minimal discomfort in low back during functional squats that was eased with lumbar stretch at counter.    Personal Factors and Comorbidities Time since onset of injury/illness/exacerbation;Fitness    Examination-Activity Limitations Bed Mobility;Sit;Bend;Sleep;Squat;Carry;Stairs;Stand;Lift;Locomotion Level    Examination-Participation Restrictions Driving;Community Activity    Stability/Clinical Decision Making Stable/Uncomplicated     Rehab Potential Good    PT Frequency 2x / week    PT Duration 8 weeks    PT Treatment/Interventions ADLs/Self Care Home Management;Cryotherapy;Electrical Stimulation;Moist Heat;Stair training;Functional mobility training;Therapeutic activities;Therapeutic exercise;Neuromuscular re-education;Manual techniques;Patient/family education;Passive range of motion;Dry needling;Taping;Joint Manipulations;Spinal Manipulations    PT Next Visit Plan Review HEP, continue with lumbar stretches (forward flexion, open book, lower trunk rotation), core strengthening (progress from PPT to dead bugs - start with only LE if needed), pallof press, dead lifts, hip hinge, doorway pec stretch, serratus punches with wand and weight    PT Home Exercise Plan [id number]    Consulted and Agree with Plan of Care Patient           Patient will benefit from skilled therapeutic intervention in order to improve the following deficits and impairments:  Decreased range of motion, Difficulty walking, Increased muscle spasms, Decreased activity tolerance, Pain, Improper body mechanics, Impaired flexibility, Decreased strength, Postural dysfunction  Visit Diagnosis: Chronic left-sided low back pain without sciatica  Muscle weakness (generalized)  Abnormal posture     Problem List Patient Active Problem List   Diagnosis Date Noted  . Pulmonary embolism (HCC) 12/14/2019  . Prediabetes 12/26/2012  . Elevated LDL cholesterol level 12/26/2012  . Hyperlipidemia LDL goal < 100 12/26/2012  . Chronic back pain 12/26/2012      Rhea Bleacher, PT, DPT 09/12/20 1:28 PM  [facility] [address] [place], [place], [address] Phone: [phone]   Fax:  [phone]  Name: Shane Oliver MRN: [id number] Date of Birth: 14-Dec-1960

## 2020-09-14 ENCOUNTER — Ambulatory Visit: Payer: Medicaid Other

## 2020-09-14 ENCOUNTER — Other Ambulatory Visit: Payer: Self-pay

## 2020-09-14 DIAGNOSIS — R293 Abnormal posture: Secondary | ICD-10-CM

## 2020-09-14 DIAGNOSIS — M545 Low back pain, unspecified: Secondary | ICD-10-CM | POA: Diagnosis not present

## 2020-09-14 DIAGNOSIS — M6281 Muscle weakness (generalized): Secondary | ICD-10-CM

## 2020-09-14 DIAGNOSIS — G8929 Other chronic pain: Secondary | ICD-10-CM

## 2020-09-14 NOTE — Therapy (Signed)
[facility] Outpatient Rehabilitation [facility] [address] [place], [place], [address] Phone: [phone]   Fax:  [phone]  Physical Therapy Treatment  Patient Details  Name: Shane Oliver MRN: [id number] Date of Birth: [birth year] Referring Provider (PT): Norva Riffle, [place]   Encounter Date: 09/14/2020   PT End of Session - 09/14/20 1133    Visit Number 5    Number of Visits 17    Date for PT Re-Evaluation 10/14/20    Authorization Type [place] [employer] healthy blue- 27 visits/year, auth submitted 10/13    PT Start Time 1130    PT Stop Time 1211    PT Time Calculation (min) 41 min    Activity Tolerance Patient tolerated treatment well    Behavior During Therapy [facility] for tasks assessed/performed           Past Medical History:  Diagnosis Date  . Asthma   . Back pain, chronic   . Chronic back pain 12/26/2012  . Hyperlipidemia LDL goal < 100 12/26/2012    History reviewed. No pertinent surgical history.  There were no vitals filed for this visit.   Subjective Assessment - 09/14/20 1134    Subjective Pt reports pain is "not bad" again but hurting like always at about a 7/10. Pt verbalizes compliance with HEP with some relief following interventions.    How long can you walk comfortably? 2-3 min    Patient Stated Goals play with grand kids- ill be there over the holidays & summer    Currently in Pain? Yes    Pain Score 7     Pain Location Back    Pain Orientation Left;Lower    Pain Descriptors / Indicators Aching;Sore    Pain Type Chronic pain    Pain Onset More than a month ago    Multiple Pain Sites Yes    Pain Score 5    Pain Location Neck    Pain Orientation Left    Pain Descriptors / Indicators Aching;Sore   "pinching at R shoulder blade"   Pain Type Chronic pain    Pain Onset More than a month ago              [facility] PT Assessment - 09/14/20 0001      Assessment   Medical Diagnosis LBP    Referring Provider (PT) Norva Riffle,  PA                         [facility] Adult PT Treatment/Exercise - 09/14/20 0001      Self-Care   Self-Care Other Self-Care Comments    Other Self-Care Comments  Reviewed HEP, postural control, posture/form during interventions, core engagement during activities, use of tennis ball against wall for self-myofascial release along L QL      Lumbar Exercises: Stretches   Active Hamstring Stretch Right;Left;2 reps;30 seconds    Active Hamstring Stretch Limitations green stretch strap in supine      Lumbar Exercises: Aerobic   Nustep L6 x 6 min      Lumbar Exercises: Standing   Functional Squats 15 reps    Functional Squats Limitations Wall squats with 5 second holds    Other Standing Lumbar Exercises Pallof press with rotation using 1 blue theraband 10x each direction; cues for form    Other Standing Lumbar Exercises Step ups 6" step 10x each LE with opposite knee drive      Lumbar Exercises: Supine   Pelvic Tilt 20 reps  Pelvic Tilt Limitations supine marches while maintaining PPT    Other Supine Lumbar Exercises Serratus punch with 5# on wand 20x      Lumbar Exercises: Quadruped   [place]/Old Horse 10 reps    Madcat/Old Horse Limitations cues for form    Other Quadruped Lumbar Exercises Child's pose forward and with lateral bias 5 x 5 sec each direction      Manual Therapy   Manual Therapy Soft tissue mobilization;Myofascial release    Soft tissue mobilization STM and myofascial release along L QL                  PT Education - 09/14/20 1215    Education Details Reviewed HEP, postural control, posture/form during interventions, core engagement during activities, use of tennis ball against wall for self-myofascial release along L QL    Person(s) Educated Patient    Methods Explanation;Demonstration;Tactile cues;Other (comment)   demonstration of tennis ball use for myofascial release   Comprehension Verbalized understanding;Returned demonstration              PT Short Term Goals - 09/14/20 1159      PT SHORT TERM GOAL #1   Title pt will verbalize daily performance of HEP exericses/stretches    Baseline began educating at eval    Time 3    Period Weeks    Status Achieved    Target Date 09/09/20      PT SHORT TERM GOAL #2   Title pt will demo proper resting posture at least 50% of the time without cues    Baseline poor resting posture at eval; still requires cues to correct posture but self corrects occasionally    Time 3    Period Weeks    Status On-going    Target Date 09/09/20             PT Long Term Goals - 08/16/20 1248      PT LONG TERM GOAL #1   Title Bil HS flexibility to at least 70 deg    Baseline 40 bil at eval    Time 8    Period Weeks    Status New    Target Date 10/14/20      PT LONG TERM GOAL #2   Title gross hip strength to 5/5    Baseline see flowsheet    Time 8    Period Weeks    Status New    Target Date 10/14/20      PT LONG TERM GOAL #3   Title pt will feel ready to participate in play with grand kids in time for holiday visit    Baseline unable at eval due to pain    Time 8    Period Weeks    Status New    Target Date 10/14/20      PT LONG TERM GOAL #4   Title average pain with ADLs <=4/10    Baseline 6/10 at rest at eval with meds in system    Time 8    Period Weeks    Status New    Target Date 10/14/20      PT LONG TERM GOAL #5   Title pt will be independent in long term HEP for continued strengthening    Baseline will progress and establish as appropriate    Time 8    Period Weeks    Status New    Target Date 10/14/20  Plan - 09/14/20 1311    Clinical Impression Statement Pt demonstrated fatigue with interventions requiring occasional brief rest breaks but did not have any significant increase in pain. Pt continues to have TTP along L QL and tightness during child's pose with bias into R sidebending.    Personal Factors and Comorbidities Time since  onset of injury/illness/exacerbation;Fitness    Examination-Activity Limitations Bed Mobility;Sit;Bend;Sleep;Squat;Carry;Stairs;Stand;Lift;Locomotion Level    Examination-Participation Restrictions Driving;Community Activity    Stability/Clinical Decision Making Stable/Uncomplicated    Rehab Potential Good    PT Frequency 2x / week    PT Duration 8 weeks    PT Treatment/Interventions ADLs/Self Care Home Management;Cryotherapy;Electrical Stimulation;Moist Heat;Stair training;Functional mobility training;Therapeutic activities;Therapeutic exercise;Neuromuscular re-education;Manual techniques;Patient/family education;Passive range of motion;Dry needling;Taping;Joint Manipulations;Spinal Manipulations    PT Next Visit Plan Review HEP, continue with lumbar stretches, core strengthening (progress from PPT + marches to dead bugs), pallof press, dead lifts, hip hinge, doorway pec stretch    PT Home Exercise Plan [id number] - LTR, SKTC, levator scap stretch, PPT, scapular retraction, upper trap stretch, PPT with marches in supine, step ups, forward and lateral FL lumbar stretch    Consulted and Agree with Plan of Care Patient           Patient will benefit from skilled therapeutic intervention in order to improve the following deficits and impairments:  Decreased range of motion, Difficulty walking, Increased muscle spasms, Decreased activity tolerance, Pain, Improper body mechanics, Impaired flexibility, Decreased strength, Postural dysfunction  Visit Diagnosis: Chronic left-sided low back pain without sciatica  Muscle weakness (generalized)  Abnormal posture     Problem List Patient Active Problem List   Diagnosis Date Noted  . Pulmonary embolism (HCC) 12/14/2019  . Prediabetes 12/26/2012  . Elevated LDL cholesterol level 12/26/2012  . Hyperlipidemia LDL goal < 100 12/26/2012  . Chronic back pain 12/26/2012     Rhea Bleacher, PT, DPT 09/14/20 1:21 PM  [facility] Outpatient  Rehabilitation [facility] [address] [place], [place], [address] Phone: [phone]   Fax:  [phone]  Name: Yobany Vroom MRN: [id number] Date of Birth: 24-Mar-1961

## 2020-09-19 ENCOUNTER — Ambulatory Visit: Payer: Medicaid Other

## 2020-09-19 ENCOUNTER — Other Ambulatory Visit: Payer: Self-pay

## 2020-09-19 DIAGNOSIS — G8929 Other chronic pain: Secondary | ICD-10-CM

## 2020-09-19 DIAGNOSIS — M545 Low back pain, unspecified: Secondary | ICD-10-CM

## 2020-09-19 DIAGNOSIS — R293 Abnormal posture: Secondary | ICD-10-CM

## 2020-09-19 DIAGNOSIS — M6281 Muscle weakness (generalized): Secondary | ICD-10-CM

## 2020-09-19 NOTE — Therapy (Signed)
[facility] Outpatient Rehabilitation [facility] [address] [place], [place], [address] Phone: [phone]   Fax:  [phone]  Physical Therapy Treatment  Patient Details  Name: Shane Oliver MRN: [id number] Date of Birth: August 13, 1961 Referring Provider (PT): Norva Riffle, [place]   Encounter Date: 09/19/2020   PT End of Session - 09/19/20 1134    Visit Number 6    Number of Visits 17    Date for PT Re-Evaluation 10/14/20    Authorization Type [place] Medicaid healthy blue- 27 visits/year, auth submitted 10/13    PT Start Time 1130    PT Stop Time 1215    PT Time Calculation (min) 45 min    Activity Tolerance Patient tolerated treatment well    Behavior During Therapy [facility] for tasks assessed/performed           Past Medical History:  Diagnosis Date  . Asthma   . Back pain, chronic   . Chronic back pain 12/26/2012  . Hyperlipidemia LDL goal < 100 12/26/2012    History reviewed. No pertinent surgical history.  There were no vitals filed for this visit.   Subjective Assessment - 09/19/20 1133    Subjective "I feel good today. It feels like I am doing better and better. I have been doing exercises at home."    How long can you walk comfortably? 2-3 min    Patient Stated Goals play with grand kids- ill be there over the holidays & summer    Currently in Pain? Yes    Pain Score 5     Pain Location Back    Pain Orientation Left;Lower    Pain Descriptors / Indicators Aching;Sore    Pain Type Chronic pain    Pain Onset More than a month ago    Multiple Pain Sites Yes    Pain Score 5    Pain Location Neck    Pain Orientation Left    Pain Descriptors / Indicators Aching;Sore    Pain Type Chronic pain    Pain Onset More than a month ago              [facility] PT Assessment - 09/19/20 0001      Assessment   Medical Diagnosis LBP    Referring Provider (PT) Norva Riffle, PA                         [facility] Adult PT Treatment/Exercise -  09/19/20 0001      Self-Care   Self-Care Other Self-Care Comments    Other Self-Care Comments  Chronic pain of left knee M25.562, G89.29      Lumbar Exercises: Stretches   Standing Side Bend Right;4 reps;20 seconds    Other Lumbar Stretch Exercise Standing lumbar stretch at free motion - forward and with lateral bias 2 x 30 sec each    Other Lumbar Stretch Exercise Seated QL and lumbar stretch using green physioball - forward and to each side 5 x 5 sec hold each direction      Lumbar Exercises: Aerobic   Nustep L6 x 7 min      Lumbar Exercises: Standing   Row Strengthening;Both;20 reps;Theraband    Theraband Level (Row) Level 4 (Blue)    Other Standing Lumbar Exercises Pallof press with rotation using 1 blue theraband 20x each direction; cues for form    Other Standing Lumbar Exercises Step ups 8" step 15x each LE with opposite knee drive      Lumbar  Exercises: Seated   Other Seated Lumbar Exercises Hip hinge with cues for form/technique 5x    Other Seated Lumbar Exercises Seated B ER using green theraband x 20 (B elbows on bolster)      Lumbar Exercises: Supine   Pelvic Tilt --    Pelvic Tilt Limitations --    Other Supine Lumbar Exercises --      Lumbar Exercises: Quadruped   Madcat/Old Horse --    Madcat/Old Horse Limitations --    Other Quadruped Lumbar Exercises Child's pose forward and with lateral bias 5 x 5 sec each direction      Manual Therapy   Manual Therapy Soft tissue mobilization;Myofascial release    Myofascial Release STM and myofascial release L QL with pt in prone and STM during seated lumbar stretch with physioball      Neck Exercises: Stretches   Upper Trapezius Stretch --    Levator Stretch 2 reps;30 seconds    Corner Stretch 2 reps;30 seconds                  PT Education - 09/19/20 1232    Education Details Chronic pain of left knee M25.562, G89.29    Person(s) Educated Patient    Methods Explanation;Demonstration;Tactile cues;Verbal  cues    Comprehension Verbalized understanding;Returned demonstration;Verbal cues required;Tactile cues required            PT Short Term Goals - 09/14/20 1159      PT SHORT TERM GOAL #1   Title pt will verbalize daily performance of HEP exericses/stretches    Baseline began educating at eval    Time 3    Period Weeks    Status Achieved    Target Date 09/09/20      PT SHORT TERM GOAL #2   Title pt will demo proper resting posture at least 50% of the time without cues    Baseline poor resting posture at eval; still requires cues to correct posture but self corrects occasionally    Time 3    Period Weeks    Status On-going    Target Date 09/09/20             PT Long Term Goals - 08/16/20 1248      PT LONG TERM GOAL #1   Title Bil HS flexibility to at least 70 deg    Baseline 40 bil at eval    Time 8    Period Weeks    Status New    Target Date 10/14/20      PT LONG TERM GOAL #2   Title gross hip strength to 5/5    Baseline see flowsheet    Time 8    Period Weeks    Status New    Target Date 10/14/20      PT LONG TERM GOAL #3   Title pt will feel ready to participate in play with grand kids in time for holiday visit    Baseline unable at eval due to pain    Time 8    Period Weeks    Status New    Target Date 10/14/20      PT LONG TERM GOAL #4   Title average pain with ADLs <=4/10    Baseline 6/10 at rest at eval with meds in system    Time 8    Period Weeks    Status New    Target Date 10/14/20      PT LONG TERM GOAL #5  Title pt will be independent in long term HEP for continued strengthening    Baseline will progress and establish as appropriate    Time 8    Period Weeks    Status New    Target Date 10/14/20                 Plan - 09/19/20 1159    Clinical Impression Statement Pt tolerated session well overall with difficulty performing hip hinge secondary to low back pain and TTP along L QL during manual therapy. Pt did well with  progression of step ups from 6" to 8" and increased repetitions of pallof press with rotation.    Personal Factors and Comorbidities Time since onset of injury/illness/exacerbation;Fitness    Examination-Activity Limitations Bed Mobility;Sit;Bend;Sleep;Squat;Carry;Stairs;Stand;Lift;Locomotion Level    Examination-Participation Restrictions Driving;Community Activity    Stability/Clinical Decision Making Stable/Uncomplicated    Rehab Potential Good    PT Frequency 2x / week    PT Duration 8 weeks    PT Treatment/Interventions ADLs/Self Care Home Management;Cryotherapy;Electrical Stimulation;Moist Heat;Stair training;Functional mobility training;Therapeutic activities;Therapeutic exercise;Neuromuscular re-education;Manual techniques;Patient/family education;Passive range of motion;Dry needling;Taping;Joint Manipulations;Spinal Manipulations    PT Next Visit Plan Review HEP, continue with lumbar stretches, core strengthening (progress from PPT + marches to dead bugs), dead lifts, hip hinge, upper trap stretch, swiss ball marches    PT Home Exercise Plan [id number] - LTR, SKTC, levator scap stretch, PPT, scapular retraction, upper trap stretch, PPT with marches in supine, step ups, forward and lateral FL lumbar stretch    Consulted and Agree with Plan of Care Patient           Patient will benefit from skilled therapeutic intervention in order to improve the following deficits and impairments:  Decreased range of motion, Difficulty walking, Increased muscle spasms, Decreased activity tolerance, Pain, Improper body mechanics, Impaired flexibility, Decreased strength, Postural dysfunction  Visit Diagnosis: Chronic left-sided low back pain without sciatica  Muscle weakness (generalized)  Abnormal posture     Problem List Patient Active Problem List   Diagnosis Date Noted  . Pulmonary embolism (HCC) 12/14/2019  . Prediabetes 12/26/2012  . Elevated LDL cholesterol level 12/26/2012  .  Hyperlipidemia LDL goal < 100 12/26/2012  . Chronic back pain 12/26/2012     Rhea Bleacher, PT, DPT 09/19/20 12:33 PM  [facility] Outpatient Rehabilitation [facility] [address] [place], [place], [address] Phone: [phone]   Fax:  [phone]  Name: Shane Oliver MRN: [id number] Date of Birth: Nov 08, 1960

## 2020-09-21 ENCOUNTER — Ambulatory Visit: Payer: Medicaid Other

## 2020-09-21 ENCOUNTER — Other Ambulatory Visit: Payer: Self-pay

## 2020-09-21 DIAGNOSIS — M545 Low back pain, unspecified: Secondary | ICD-10-CM

## 2020-09-21 DIAGNOSIS — M6281 Muscle weakness (generalized): Secondary | ICD-10-CM

## 2020-09-21 DIAGNOSIS — R293 Abnormal posture: Secondary | ICD-10-CM

## 2020-09-21 DIAGNOSIS — G8929 Other chronic pain: Secondary | ICD-10-CM

## 2020-09-21 NOTE — Therapy (Signed)
[facility] Outpatient Rehabilitation [facility] [address] [place], [place], [address] Phone: [phone]   Fax:  [phone]  Physical Therapy Treatment  Patient Details  Name: Shane Oliver MRN: [id number] Date of Birth: October 18, 1961 Referring Provider (PT): Norva Riffle, [place]   Encounter Date: 09/21/2020   PT End of Session - 09/21/20 1136    Visit Number 7    Number of Visits 17    Date for PT Re-Evaluation 10/14/20    Authorization Type  Medicaid healthy blue- 27 visits/year, auth submitted 10/13    PT Start Time 1136    PT Stop Time 1214    PT Time Calculation (min) 38 min    Activity Tolerance Patient tolerated treatment well    Behavior During Therapy [facility] for tasks assessed/performed           Past Medical History:  Diagnosis Date  . Asthma   . Back pain, chronic   . Chronic back pain 12/26/2012  . Hyperlipidemia LDL goal < 100 12/26/2012    History reviewed. No pertinent surgical history.  There were no vitals filed for this visit.   Subjective Assessment - 09/21/20 1139    Subjective "My back is hurting today. I twisted in my sleep, and it woke me right up." Pt reports taking pain medication today due to aggravation of symptoms.    How long can you walk comfortably? 2-3 min    Patient Stated Goals play with grand kids- ill be there over the holidays & summer    Currently in Pain? Yes    Pain Score 9     Pain Location Back    Pain Orientation Right;Left;Mid;Lower    Pain Descriptors / Indicators Aching;Sore    Pain Type Chronic pain    Pain Onset More than a month ago    Pain Score 5    Pain Location Neck    Pain Orientation Left    Pain Descriptors / Indicators Aching;Sore    Pain Type Chronic pain    Pain Onset More than a month ago              [facility] PT Assessment - 09/21/20 0001      Assessment   Medical Diagnosis LBP    Referring Provider (PT) Norva Riffle, PA                         [facility]  Adult PT Treatment/Exercise - 09/21/20 0001      Self-Care   Self-Care Other Self-Care Comments    Other Self-Care Comments  Instructed pt to consistently perform HEP, education regarding TPDN and expected response, and instructed to move around post-TPDN and take a warm/hot shower after a few hours if able/needed.      Neck Exercises: Standing   Other Standing Exercises Ys at wall 20x with cues for form initially      Lumbar Exercises: Stretches   Lower Trunk Rotation Limitations LTR x 20 (10x each direction)    Other Lumbar Stretch Exercise Seated QL and lumbar stretch using green physioball - forward and to each side 5 x 5 sec hold each direction      Lumbar Exercises: Aerobic   Nustep L6 x 6 min      Lumbar Exercises: Standing   Row Strengthening;Both;20 reps;Theraband    Theraband Level (Row) Level 4 (Blue)      Lumbar Exercises: Seated   Other Seated Lumbar Exercises Hip hinge with cues for form/technique 20x  Lumbar Exercises: Supine   Dead Bug 20 reps      Manual Therapy   Manual Therapy Soft tissue mobilization;Myofascial release    Manual therapy comments Skilled observation, inspection, and palpation durint TPDN of L QL by [employer].    Myofascial Release STM and myofascial release along L QL      Neck Exercises: Stretches   Levator Stretch Right;Left;2 reps;30 seconds            Trigger Point Dry Needling - 09/21/20 0001    Consent Given? Yes    Education Handout Provided --   verbal education provided   Muscles Treated Back/Hip Quadratus lumborum   Left   Quadratus Lumborum Response Twitch response elicited                PT Education - 09/21/20 1326    Education Details Instructed pt to consistently perform HEP, education regarding TPDN and expected response, and instructed to move around post-TPDN and take a warm/hot shower after a few hours if able/needed.    Person(s) Educated Patient    Methods Explanation;Demonstration;Tactile  cues;Verbal cues    Comprehension Verbalized understanding;Returned demonstration;Verbal cues required;Tactile cues required            PT Short Term Goals - 09/14/20 1159      PT SHORT TERM GOAL #1   Title pt will verbalize daily performance of HEP exericses/stretches    Baseline began educating at eval    Time 3    Period Weeks    Status Achieved    Target Date 09/09/20      PT SHORT TERM GOAL #2   Title pt will demo proper resting posture at least 50% of the time without cues    Baseline poor resting posture at eval; still requires cues to correct posture but self corrects occasionally    Time 3    Period Weeks    Status On-going    Target Date 09/09/20             PT Long Term Goals - 08/16/20 1248      PT LONG TERM GOAL #1   Title Bil HS flexibility to at least 70 deg    Baseline 40 bil at eval    Time 8    Period Weeks    Status New    Target Date 10/14/20      PT LONG TERM GOAL #2   Title gross hip strength to 5/5    Baseline see flowsheet    Time 8    Period Weeks    Status New    Target Date 10/14/20      PT LONG TERM GOAL #3   Title pt will feel ready to participate in play with grand kids in time for holiday visit    Baseline unable at eval due to pain    Time 8    Period Weeks    Status New    Target Date 10/14/20      PT LONG TERM GOAL #4   Title average pain with ADLs <=4/10    Baseline 6/10 at rest at eval with meds in system    Time 8    Period Weeks    Status New    Target Date 10/14/20      PT LONG TERM GOAL #5   Title pt will be independent in long term HEP for continued strengthening    Baseline will progress and establish as appropriate  Time 8    Period Weeks    Status New    Target Date 10/14/20                 Plan - 09/21/20 1327    Clinical Impression Statement Pt presents today with significant TTP along L QL that remained tender with STM and myofascial release. Pt education provided regarding TPDN and pt  showed interest in trying it during today's session. Pt expressed significant relief with 0/10 LBP following TPDN of L QL. He demonstrated good tolerance with interventions afterwards and was able to perform hip hinge without low back pain. Pt inquired about dry needling in his upper back where he has pinching and pain often as well potentially next session.    Personal Factors and Comorbidities Time since onset of injury/illness/exacerbation;Fitness    Examination-Activity Limitations Bed Mobility;Sit;Bend;Sleep;Squat;Carry;Stairs;Stand;Lift;Locomotion Level    Examination-Participation Restrictions Driving;Community Activity    Stability/Clinical Decision Making Stable/Uncomplicated    Rehab Potential Good    PT Frequency 2x / week    PT Duration 8 weeks    PT Treatment/Interventions ADLs/Self Care Home Management;Cryotherapy;Electrical Stimulation;Moist Heat;Stair training;Functional mobility training;Therapeutic activities;Therapeutic exercise;Neuromuscular re-education;Manual techniques;Patient/family education;Passive range of motion;Dry needling;Taping;Joint Manipulations;Spinal Manipulations    PT Next Visit Plan Assess response to TPDN of L QL, Review HEP, continue with lumbar stretches, progress core strengthening, dead lifts, continue hip hinge, swiss ball marches    PT Home Exercise Plan [id number] - LTR, SKTC, levator scap stretch, PPT, scapular retraction, upper trap stretch, PPT with marches in supine, step ups, forward and lateral FL lumbar stretch    Consulted and Agree with Plan of Care Patient           Patient will benefit from skilled therapeutic intervention in order to improve the following deficits and impairments:  Decreased range of motion, Difficulty walking, Increased muscle spasms, Decreased activity tolerance, Pain, Improper body mechanics, Impaired flexibility, Decreased strength, Postural dysfunction  Visit Diagnosis: Chronic left-sided low back pain without  sciatica  Muscle weakness (generalized)  Abnormal posture     Problem List Patient Active Problem List   Diagnosis Date Noted  . Pulmonary embolism (HCC) 12/14/2019  . Prediabetes 12/26/2012  . Elevated LDL cholesterol level 12/26/2012  . Hyperlipidemia LDL goal < 100 12/26/2012  . Chronic back pain 12/26/2012     Rhea Bleacher, PT, DPT 09/21/20 1:33 PM  [facility] Outpatient Rehabilitation [facility] [address] [place], [place], [address] Phone: [phone]   Fax:  [phone]  Name: Shane Oliver MRN: [id number] Date of Birth: 03-Jun-1961

## 2020-09-26 ENCOUNTER — Ambulatory Visit: Payer: Medicaid Other

## 2020-09-26 ENCOUNTER — Other Ambulatory Visit: Payer: Self-pay

## 2020-09-26 DIAGNOSIS — M545 Low back pain, unspecified: Secondary | ICD-10-CM | POA: Diagnosis not present

## 2020-09-26 DIAGNOSIS — M6281 Muscle weakness (generalized): Secondary | ICD-10-CM

## 2020-09-26 DIAGNOSIS — G8929 Other chronic pain: Secondary | ICD-10-CM

## 2020-09-26 DIAGNOSIS — R293 Abnormal posture: Secondary | ICD-10-CM

## 2020-09-26 NOTE — Therapy (Signed)
[facility] Outpatient Rehabilitation [facility] [address] [place], [place], [address] Phone: [phone]   Fax:  [phone]  Physical Therapy Treatment  Patient Details  Name: Shane Oliver MRN: [id number] Date of Birth: [birth year] Referring Provider (PT): Norva Riffle, [place]   Encounter Date: 09/26/2020   PT End of Session - 09/26/20 1140    Visit Number 8    Number of Visits 17    Date for PT Re-Evaluation 10/14/20    Authorization Type [place] Medicaid healthy blue- 27 visits/year, auth submitted 10/13    PT Start Time 1135   pt arrived late   PT Stop Time 1214    PT Time Calculation (min) 39 min    Activity Tolerance Patient tolerated treatment well    Behavior During Therapy [facility] for tasks assessed/performed           Past Medical History:  Diagnosis Date  . Asthma   . Back pain, chronic   . Chronic back pain 12/26/2012  . Hyperlipidemia LDL goal < 100 12/26/2012    History reviewed. No pertinent surgical history.  There were no vitals filed for this visit.   Subjective Assessment - 09/26/20 1141    Subjective Pt states "it felt so good. I love that thing" when asked about his response to TPDN along L QL from previous session. He reports having some low back and mid back pain still but much better than it was.    How long can you walk comfortably? 2-3 min    Patient Stated Goals play with grand kids- ill be there over the holidays & summer    Currently in Pain? Yes    Pain Score 5     Pain Location Back    Pain Orientation Mid;Lower    Pain Descriptors / Indicators Sharp    Pain Type Chronic pain    Pain Onset More than a month ago    Pain Score 7    Pain Location Neck    Pain Orientation Left    Pain Descriptors / Indicators Aching;Sore    Pain Type Chronic pain    Pain Onset More than a month ago              [facility] PT Assessment - 09/26/20 0001      Assessment   Medical Diagnosis LBP    Referring Provider (PT) Norva Riffle, PA                         [facility] Adult PT Treatment/Exercise - 09/26/20 0001      Self-Care   Self-Care Other Self-Care Comments    Other Self-Care Comments  Reviewed HEP over for over Thanksgiving, reviewed TPDN and expected response, and instructed to move around post-TPDN and take a warm/hot shower after a few hours if able/needed.      Lumbar Exercises: Aerobic   Nustep L6 x 6 min      Lumbar Exercises: Standing   Row Strengthening;Both;20 reps;Theraband    Theraband Level (Row) Other (comment)   Black     Lumbar Exercises: Quadruped   Madcat/Old Horse 15 reps    Other Quadruped Lumbar Exercises Quadruped IYT with cues for core activation 5 x each direction      Manual Therapy   Manual Therapy Soft tissue mobilization;Myofascial release    Manual therapy comments Skilled observation, inspection, and palpation during TPDN of L levator scap and rhomboids by Lulu Riding    Myofascial Release  STM, IASTM, and myofascial release along L levator scap and rhomboids      Neck Exercises: Stretches   Levator Stretch Left;2 reps;30 seconds            Trigger Point Dry Needling - 09/26/20 0001    Consent Given? Yes    Education Handout Provided Previously provided    Muscles Treated Head and Neck Levator scapulae   Left   Muscles Treated Upper Quadrant Rhomboids   Left   Levator Scapulae Response Twitch response elicited    Rhomboids Response Twitch response elicited                PT Education - 09/26/20 1308    Education Details Reviewed HEP over for over Thanksgiving, reviewed TPDN and expected response, and instructed to move around post-TPDN and take a warm/hot shower after a few hours if able/needed.    Person(s) Educated Patient    Methods Explanation;Demonstration;Tactile cues;Verbal cues    Comprehension Verbalized understanding;Returned demonstration;Verbal cues required;Tactile cues required            PT Short Term Goals -  09/14/20 1159      PT SHORT TERM GOAL #1   Title pt will verbalize daily performance of HEP exericses/stretches    Baseline began educating at eval    Time 3    Period Weeks    Status Achieved    Target Date 09/09/20      PT SHORT TERM GOAL #2   Title pt will demo proper resting posture at least 50% of the time without cues    Baseline poor resting posture at eval; still requires cues to correct posture but self corrects occasionally    Time 3    Period Weeks    Status On-going    Target Date 09/09/20             PT Long Term Goals - 08/16/20 1248      PT LONG TERM GOAL #1   Title Bil HS flexibility to at least 70 deg    Baseline 40 bil at eval    Time 8    Period Weeks    Status New    Target Date 10/14/20      PT LONG TERM GOAL #2   Title gross hip strength to 5/5    Baseline see flowsheet    Time 8    Period Weeks    Status New    Target Date 10/14/20      PT LONG TERM GOAL #3   Title pt will feel ready to participate in play with grand kids in time for holiday visit    Baseline unable at eval due to pain    Time 8    Period Weeks    Status New    Target Date 10/14/20      PT LONG TERM GOAL #4   Title average pain with ADLs <=4/10    Baseline 6/10 at rest at eval with meds in system    Time 8    Period Weeks    Status New    Target Date 10/14/20      PT LONG TERM GOAL #5   Title pt will be independent in long term HEP for continued strengthening    Baseline will progress and establish as appropriate    Time 8    Period Weeks    Status New    Target Date 10/14/20  Plan - 09/26/20 1141    Clinical Impression Statement Emphasis on manual techniques today due to pain along L levator scapulae and L rhomboids. Pt continued to have significant TTP with STM and IASTM and showed interest in TPDN due to his pain relief from TPDN along L quadratus lumborum during previous session. Pt states "it feels so much better already" following  TPDN of L levator scap and rhomboids. Pt will benefit from continued skilled PT for stretching and strengthening to maintain pain relief and improve activity tolerance to allow him to play with his grandchildren over the holidays.    Personal Factors and Comorbidities Time since onset of injury/illness/exacerbation;Fitness    Examination-Activity Limitations Bed Mobility;Sit;Bend;Sleep;Squat;Carry;Stairs;Stand;Lift;Locomotion Level    Examination-Participation Restrictions Driving;Community Activity    Stability/Clinical Decision Making Stable/Uncomplicated    Rehab Potential Good    PT Frequency 2x / week    PT Duration 8 weeks    PT Treatment/Interventions ADLs/Self Care Home Management;Cryotherapy;Electrical Stimulation;Moist Heat;Stair training;Functional mobility training;Therapeutic activities;Therapeutic exercise;Neuromuscular re-education;Manual techniques;Patient/family education;Passive range of motion;Dry needling;Taping;Joint Manipulations;Spinal Manipulations    PT Next Visit Plan Assess response to TPDN of L levator scap and rhomboids, Review HEP, continue with lumbar stretches, progress core strengthening, dead lifts, continue hip hinge, swiss ball marches    PT Home Exercise Plan [id number] - LTR, SKTC, levator scap stretch, PPT, scapular retraction, upper trap stretch, PPT with marches in supine, step ups, forward and lateral FL lumbar stretch    Consulted and Agree with Plan of Care Patient           Patient will benefit from skilled therapeutic intervention in order to improve the following deficits and impairments:  Decreased range of motion, Difficulty walking, Increased muscle spasms, Decreased activity tolerance, Pain, Improper body mechanics, Impaired flexibility, Decreased strength, Postural dysfunction  Visit Diagnosis: Chronic left-sided low back pain without sciatica  Muscle weakness (generalized)  Abnormal posture     Problem List Patient Active Problem List     Diagnosis Date Noted  . Pulmonary embolism (HCC) 12/14/2019  . Prediabetes 12/26/2012  . Elevated LDL cholesterol level 12/26/2012  . Hyperlipidemia LDL goal < 100 12/26/2012  . Chronic back pain 12/26/2012    Rhea Bleacher, PT, DPT 09/26/20 1:14 PM  [facility] Outpatient Rehabilitation [facility] [address] [place], [place], [address] Phone: [phone]   Fax:  [phone]  Name: Shane Oliver MRN: [id number] Date of Birth: [birth year]

## 2020-10-03 ENCOUNTER — Ambulatory Visit: Payer: Medicaid Other

## 2020-10-03 ENCOUNTER — Other Ambulatory Visit: Payer: Self-pay

## 2020-10-03 DIAGNOSIS — M6281 Muscle weakness (generalized): Secondary | ICD-10-CM

## 2020-10-03 DIAGNOSIS — M545 Low back pain, unspecified: Secondary | ICD-10-CM

## 2020-10-03 DIAGNOSIS — G8929 Other chronic pain: Secondary | ICD-10-CM

## 2020-10-03 DIAGNOSIS — R293 Abnormal posture: Secondary | ICD-10-CM

## 2020-10-03 NOTE — Therapy (Signed)
[facility] Outpatient Rehabilitation [facility] [address] [place], [place], [address] Phone: [phone]   Fax:  [phone]  Physical Therapy Treatment  Patient Details  Name: Shane Oliver MRN: [id number] Date of Birth: 12/15/1960 Referring Provider (PT): Norva Riffle, [place]   Encounter Date: 10/03/2020   PT End of Session - 10/03/20 1137    Visit Number 9    Number of Visits 17    Date for PT Re-Evaluation 10/14/20    Authorization Type [place] Medicaid healthy blue- 27 visits/year, auth submitted 10/13    PT Start Time 1133   pt arrived late   PT Stop Time 1213    PT Time Calculation (min) 40 min    Activity Tolerance Patient tolerated treatment well    Behavior During Therapy [facility] for tasks assessed/performed           Past Medical History:  Diagnosis Date  . Asthma   . Back pain, chronic   . Chronic back pain 12/26/2012  . Hyperlipidemia LDL goal < 100 12/26/2012    History reviewed. No pertinent surgical history.  There were no vitals filed for this visit.       [facility] PT Assessment - 10/03/20 0001      Assessment   Medical Diagnosis LBP    Referring Provider (PT) Norva Riffle, PA                         [facility] Adult PT Treatment/Exercise - 10/03/20 0001      Self-Care   Self-Care Other Self-Care Comments    Other Self-Care Comments  Updated HEP and provided blue and black therabands. Demonstrated and had pt practice self-myofascial release using tennis ball along rhomboids       Neck Exercises: Standing   Wall Push Ups Limitations Push up plus at free motion (scapular protraction) x 20    Other Standing Exercises Ys at wall 20x  with 1# dumbbells with cues for form initially      Lumbar Exercises: Aerobic   Nustep L6 x 6 min      Lumbar Exercises: Standing   Row Strengthening;Both;Theraband;Other (comment)   30   Theraband Level (Row) Other (comment)   Black   Shoulder Extension AROM;Strengthening;Both;20  reps;Theraband    Theraband Level (Shoulder Extension) Level 4 (Blue)    Other Standing Lumbar Exercises Pallof press with rotation using 1 blue theraband 15x each direction; cues for form    Other Standing Lumbar Exercises Hip hinge standing 10x      Lumbar Exercises: Seated   Other Seated Lumbar Exercises Hip hinge with cues for form/technique 10x      Lumbar Exercises: Quadruped   Other Quadruped Lumbar Exercises Quadruped IYT 1# dumbbells with cues for core activation 5 x each direction      Neck Exercises: Stretches   Other Neck Stretches Self-myofascial release along L rhomboids using tennis ball against wall                  PT Education - 10/03/20 1321    Education Details Updated HEP and provided blue and black therabands. Demonstrated and had pt practice self-myofascial release using tennis ball along rhomboids    Person(s) Educated Patient    Methods Explanation;Demonstration;Tactile cues;Verbal cues;Handout    Comprehension Verbalized understanding;Returned demonstration;Verbal cues required;Tactile cues required            PT Short Term Goals - 09/14/20 1159      PT SHORT TERM  GOAL #1   Title pt will verbalize daily performance of HEP exericses/stretches    Baseline began educating at eval    Time 3    Period Weeks    Status Achieved    Target Date 09/09/20      PT SHORT TERM GOAL #2   Title pt will demo proper resting posture at least 50% of the time without cues    Baseline poor resting posture at eval; still requires cues to correct posture but self corrects occasionally    Time 3    Period Weeks    Status On-going    Target Date 09/09/20             PT Long Term Goals - 08/16/20 1248      PT LONG TERM GOAL #1   Title Bil HS flexibility to at least 70 deg    Baseline 40 bil at eval    Time 8    Period Weeks    Status New    Target Date 10/14/20      PT LONG TERM GOAL #2   Title gross hip strength to 5/5    Baseline see flowsheet     Time 8    Period Weeks    Status New    Target Date 10/14/20      PT LONG TERM GOAL #3   Title pt will feel ready to participate in play with grand kids in time for holiday visit    Baseline unable at eval due to pain    Time 8    Period Weeks    Status New    Target Date 10/14/20      PT LONG TERM GOAL #4   Title average pain with ADLs <=4/10    Baseline 6/10 at rest at eval with meds in system    Time 8    Period Weeks    Status New    Target Date 10/14/20      PT LONG TERM GOAL #5   Title pt will be independent in long term HEP for continued strengthening    Baseline will progress and establish as appropriate    Time 8    Period Weeks    Status New    Target Date 10/14/20                 Plan - 10/03/20 1137    Clinical Impression Statement Patient tolerated treatment session well with fatigue during interventions but no complaints of significant increase in pain. Pt experienced fatigue primarily during Ys at wall with 1# dumbbells in each hand and when performing hip hinge standing and sitting.    Personal Factors and Comorbidities Time since onset of injury/illness/exacerbation;Fitness    Examination-Activity Limitations Bed Mobility;Sit;Bend;Sleep;Squat;Carry;Stairs;Stand;Lift;Locomotion Level    Examination-Participation Restrictions Driving;Community Activity    Stability/Clinical Decision Making Stable/Uncomplicated    Rehab Potential Good    PT Frequency 2x / week    PT Duration 8 weeks    PT Treatment/Interventions ADLs/Self Care Home Management;Cryotherapy;Electrical Stimulation;Moist Heat;Stair training;Functional mobility training;Therapeutic activities;Therapeutic exercise;Neuromuscular re-education;Manual techniques;Patient/family education;Passive range of motion;Dry needling;Taping;Joint Manipulations;Spinal Manipulations    PT Next Visit Plan Review HEP, continue with lumbar stretches, progress core strengthening, dead lifts,  swiss ball marches     PT Home Exercise Plan [phone] - LTR, SKTC, levator scap stretch, PPT, scapular retraction, upper trap stretch, PPT with marches in supine, step ups, forward and lateral FL lumbar stretch, hip hinge (standing and seated), rows, pallof press,  cat/camel, quadruped alternating UE    Consulted and Agree with Plan of Care Patient           Patient will benefit from skilled therapeutic intervention in order to improve the following deficits and impairments:  Decreased range of motion, Difficulty walking, Increased muscle spasms, Decreased activity tolerance, Pain, Improper body mechanics, Impaired flexibility, Decreased strength, Postural dysfunction  Visit Diagnosis: Chronic left-sided low back pain without sciatica  Muscle weakness (generalized)  Abnormal posture     Problem List Patient Active Problem List   Diagnosis Date Noted  . Pulmonary embolism (HCC) 12/14/2019  . Prediabetes 12/26/2012  . Elevated LDL cholesterol level 12/26/2012  . Hyperlipidemia LDL goal < 100 12/26/2012  . Chronic back pain 12/26/2012     Rhea Bleacher, PT, DPT 10/03/20 1:29 PM  [facility] Outpatient Rehabilitation [facility] [address] [place], [place], [address] Phone: [phone]   Fax:  [phone]  Name: Harvard Zeiss MRN: [id number] Date of Birth: December 08, 1960

## 2020-10-05 ENCOUNTER — Ambulatory Visit: Payer: Medicaid Other

## 2020-10-10 ENCOUNTER — Ambulatory Visit: Payer: Medicaid Other | Attending: Physician Assistant

## 2020-10-10 ENCOUNTER — Other Ambulatory Visit: Payer: Self-pay

## 2020-10-10 DIAGNOSIS — G8929 Other chronic pain: Secondary | ICD-10-CM

## 2020-10-10 DIAGNOSIS — R293 Abnormal posture: Secondary | ICD-10-CM | POA: Diagnosis present

## 2020-10-10 DIAGNOSIS — M6281 Muscle weakness (generalized): Secondary | ICD-10-CM | POA: Diagnosis present

## 2020-10-10 DIAGNOSIS — M545 Low back pain, unspecified: Secondary | ICD-10-CM

## 2020-10-10 NOTE — Therapy (Signed)
[facility] Outpatient Rehabilitation [facility] [address] [place], [place], [address] Phone: [phone]   Fax:  [phone]  Physical Therapy Treatment  Patient Details  Name: Shane Oliver MRN: [id number] Date of Birth: [birth year] Referring Provider (PT): Norva Riffle, [place]   Encounter Date: 10/10/2020   PT End of Session - 10/10/20 1217    Visit Number 10    Number of Visits 17    Date for PT Re-Evaluation 10/14/20    Authorization Type [place] Medicaid healthy blue- 27 visits/year, auth submitted 10/13    PT Start Time 1130    PT Stop Time 1212    PT Time Calculation (min) 42 min    Activity Tolerance Patient tolerated treatment well    Behavior During Therapy [facility] for tasks assessed/performed           Past Medical History:  Diagnosis Date  . Asthma   . Back pain, chronic   . Chronic back pain 12/26/2012  . Hyperlipidemia LDL goal < 100 12/26/2012    History reviewed. No pertinent surgical history.  There were no vitals filed for this visit.   Subjective Assessment - 10/10/20 1155    Subjective Patient reports having 7/10 LBP with some symptoms down LLE (posterior thigh but not past knee) since yesterday when he was performing standing hip hinge. He complains of continued pinching in mid-back on L side occasionally but states it is not as bad as it was before initial dry needling.    How long can you walk comfortably? 2-3 min    Patient Stated Goals play with grand kids- ill be there over the holidays & summer    Currently in Pain? Yes    Pain Score 7     Pain Location Back    Pain Orientation Mid;Lower;Upper;Left    Pain Descriptors / Indicators Aching;Sharp    Pain Type Chronic pain    Pain Radiating Towards shooting pain into LLE    Pain Onset More than a month ago    Pain Onset More than a month ago              [facility] PT Assessment - 10/10/20 0001      Assessment   Medical Diagnosis LBP    Referring Provider (PT) Norva Riffle, PA                         [facility] Adult PT Treatment/Exercise - 10/10/20 0001      Self-Care   Self-Care Other Self-Care Comments    Other Self-Care Comments  Reviewed expected response to TPDN and urged patient to use tennis ball for self-STM as needed. Advised pt to perform prone lying and prone press ups when he has aggravation of LBP with symptoms down LLE and to assess response - pt advised to hold off if symptoms worsen or go further down LLE.      Lumbar Exercises: Aerobic   Nustep L6 x 6 min      Lumbar Exercises: Standing   Row Strengthening;Both;Theraband;Other (comment);20 reps    Theraband Level (Row) Other (comment)   Black     Lumbar Exercises: Prone   Other Prone Lumbar Exercises Prone lying for 1.5 minutes then prone press ups x 10      Lumbar Exercises: Quadruped   Madcat/Old Horse 20 reps    Other Quadruped Lumbar Exercises Fire hydrant x 20 each LE with cues for posture and core activation  Manual Therapy   Manual Therapy Soft tissue mobilization;Myofascial release;Joint mobilization    Manual therapy comments Skilled observation, inspection, and palpation during TPDN of L rhomboids and L thoracic paraspinals by Lorayne Bender.    Myofascial Release STM and myofascial release of L rhomboids, L thoracic paraspinals, and L piriformis            Trigger Point Dry Needling - 10/10/20 0001    Consent Given? Yes    Education Handout Provided Previously provided    Muscles Treated Upper Quadrant Rhomboids   Left; L thoracic paraspinals   Rhomboids Response Twitch response elicited                PT Education - 10/10/20 1338    Education Details Reviewed expected response to TPDN and urged patient to use tennis ball for self-STM as needed. Advised pt to perform prone lying and prone press ups when he has aggravation of LBP with symptoms down LLE and to assess response - pt advised to hold off if symptoms worsen or go further down  LLE.    Person(s) Educated Patient    Methods Explanation;Demonstration    Comprehension Verbalized understanding;Returned demonstration            PT Short Term Goals - 09/14/20 1159      PT SHORT TERM GOAL #1   Title pt will verbalize daily performance of HEP exericses/stretches    Baseline began educating at eval    Time 3    Period Weeks    Status Achieved    Target Date 09/09/20      PT SHORT TERM GOAL #2   Title pt will demo proper resting posture at least 50% of the time without cues    Baseline poor resting posture at eval; still requires cues to correct posture but self corrects occasionally    Time 3    Period Weeks    Status On-going    Target Date 09/09/20             PT Long Term Goals - 08/16/20 1248      PT LONG TERM GOAL #1   Title Bil HS flexibility to at least 70 deg    Baseline 40 bil at eval    Time 8    Period Weeks    Status New    Target Date 10/14/20      PT LONG TERM GOAL #2   Title gross hip strength to 5/5    Baseline see flowsheet    Time 8    Period Weeks    Status New    Target Date 10/14/20      PT LONG TERM GOAL #3   Title pt will feel ready to participate in play with grand kids in time for holiday visit    Baseline unable at eval due to pain    Time 8    Period Weeks    Status New    Target Date 10/14/20      PT LONG TERM GOAL #4   Title average pain with ADLs <=4/10    Baseline 6/10 at rest at eval with meds in system    Time 8    Period Weeks    Status New    Target Date 10/14/20      PT LONG TERM GOAL #5   Title pt will be independent in long term HEP for continued strengthening    Baseline will progress and establish as appropriate  Time 8    Period Weeks    Status New    Target Date 10/14/20                 Plan - 10/10/20 1205    Clinical Impression Statement Patient tolerated treatment session well with no adverse effects with interventions or during/after TPDN to L rhomboids and thoracic  paraspinals. He had ease of LBP and symptoms down LLE following manual therapy and prone lying/press ups. He will benefit from continued skilled PT for strengthening to increase tolerance to daily activities.    Personal Factors and Comorbidities Time since onset of injury/illness/exacerbation;Fitness    Examination-Activity Limitations Bed Mobility;Sit;Bend;Sleep;Squat;Carry;Stairs;Stand;Lift;Locomotion Level    Examination-Participation Restrictions Driving;Community Activity    Stability/Clinical Decision Making Stable/Uncomplicated    Rehab Potential Good    PT Frequency 2x / week    PT Duration 8 weeks    PT Treatment/Interventions ADLs/Self Care Home Management;Cryotherapy;Electrical Stimulation;Moist Heat;Stair training;Functional mobility training;Therapeutic activities;Therapeutic exercise;Neuromuscular re-education;Manual techniques;Patient/family education;Passive range of motion;Dry needling;Taping;Joint Manipulations;Spinal Manipulations    PT Next Visit Plan Re-evaluation. Assess response to TPDN. Review HEP, continue with lumbar stretches, progress core strengthening,  swiss ball marches    PT Home Exercise Plan [phone] - LTR, SKTC, levator scap stretch, PPT, scapular retraction, upper trap stretch, PPT with marches in supine, step ups, hip hinge (standing and seated), rows, pallof press, cat/camel, quadruped alternating UE, prone lying and prone press up    Consulted and Agree with Plan of Care Patient           Patient will benefit from skilled therapeutic intervention in order to improve the following deficits and impairments:  Decreased range of motion, Difficulty walking, Increased muscle spasms, Decreased activity tolerance, Pain, Improper body mechanics, Impaired flexibility, Decreased strength, Postural dysfunction  Visit Diagnosis: Chronic left-sided low back pain without sciatica  Muscle weakness (generalized)  Abnormal posture     Problem List Patient Active  Problem List   Diagnosis Date Noted  . Pulmonary embolism (HCC) 12/14/2019  . Prediabetes 12/26/2012  . Elevated LDL cholesterol level 12/26/2012  . Hyperlipidemia LDL goal < 100 12/26/2012  . Chronic back pain 12/26/2012     Rhea Bleacher, PT, DPT 10/10/20 1:41 PM  [facility] Outpatient Rehabilitation [facility] [address] [place], [place], [address] Phone: [phone]   Fax:  [phone]  Name: Shane Oliver MRN: [id number] Date of Birth: [birth year]

## 2020-10-12 ENCOUNTER — Ambulatory Visit: Payer: Medicaid Other

## 2020-10-12 ENCOUNTER — Other Ambulatory Visit: Payer: Self-pay

## 2020-10-12 DIAGNOSIS — R293 Abnormal posture: Secondary | ICD-10-CM

## 2020-10-12 DIAGNOSIS — G8929 Other chronic pain: Secondary | ICD-10-CM

## 2020-10-12 DIAGNOSIS — M545 Low back pain, unspecified: Secondary | ICD-10-CM | POA: Diagnosis not present

## 2020-10-12 DIAGNOSIS — M6281 Muscle weakness (generalized): Secondary | ICD-10-CM

## 2020-10-12 NOTE — Addendum Note (Signed)
Addended by: Rhea Bleacher B on: 10/12/2020 12:53 PM   Modules accepted: Orders

## 2020-10-12 NOTE — Therapy (Addendum)
[place], [place], [address] Phone: [phone]   Fax:  [phone]  Physical Therapy Treatment/Re-evaluation  Patient Details  Name: Shane Oliver MRN: [id number] Date of Birth: September 23, 1961 Referring Provider (PT): Raelyn Number, [place]   Encounter Date: 10/12/2020   PT End of Session - 10/12/20 1133    Visit Number 11    Number of Visits 23    Date for PT Re-Evaluation 12/16/20    Authorization Type [place] [employer] healthy blue- 27 visits/year, auth submitted 10/13    PT Start Time 1130    PT Stop Time 1213    PT Time Calculation (min) 43 min    Activity Tolerance Patient tolerated treatment well    Behavior During Therapy [facility] for tasks assessed/performed           Past Medical History:  Diagnosis Date  . Asthma   . Back pain, chronic   . Chronic back pain 12/26/2012  . Hyperlipidemia LDL goal < 100 12/26/2012    History reviewed. No pertinent surgical history.  There were no vitals filed for this visit.   Subjective Assessment - 10/12/20 1133    Subjective "I still get pain, but it's not as bad as it was before I started. I'm up to walking 4,000 steps around the house every day. I just got up to doing it every day. I was doing 2-3x/week before that. I didn't know how tight my muscles were before."    How long can you walk comfortably? 2-3 min    Patient Stated Goals play with grand kids- ill be there over the holidays & summer    Currently in Pain? Yes    Pain Score 5     Pain Location Back    Pain Orientation Mid;Lower    Pain Descriptors / Indicators Aching    Pain Type Chronic pain    Pain Onset More than a month ago    Multiple Pain Sites Yes    Pain Score 4    Pain Location Thoracic    Pain Orientation Left    Pain Descriptors / Indicators Aching;Sore    Pain Type Chronic pain    Pain Onset More than a month ago              [facility] PT Assessment - 10/12/20 0001       Assessment   Medical Diagnosis LBP    Referring Provider (PT) Raelyn Number, PA      AROM   Overall AROM Comments Pain with FL and SB to both sides, especially with L SB    Lumbar Flexion to ~4 inches proximal to malleoli (mid shin) with no pain    Lumbar Extension WFL and no pain    Lumbar - Right Side Bend Lateral knee joint line    Lumbar - Left Side Bend 1 inch proximal to lateral knee joint line    Lumbar - Right Rotation WFL    Lumbar - Left Rotation [facility]      Strength   Strength Assessment Site Hip;Knee;Ankle    Right Hip Flexion 4+/5    Right Hip ABduction 4+/5    Right Hip ADduction 4+/5    Left Hip Flexion 4+/5    Left Hip ABduction 4+/5    Left Hip ADduction 4+/5    Right/Left Knee Left;Right    Right Knee Flexion 4+/5    Right Knee Extension 4+/5    Left Knee Flexion 4+/5    Left  Knee Extension 4+/5    Right/Left Ankle Right;Left    Right Ankle Dorsiflexion 5/5    Right Ankle Plantar Flexion 5/5   modified in sitting position   Left Ankle Dorsiflexion 5/5    Left Ankle Plantar Flexion 5/5   modified in sitting position                        [facility] Adult PT Treatment/Exercise - 10/12/20 0001      Self-Care   Self-Care Other Self-Care Comments    Other Self-Care Comments  Reviewed HEP over the next few weeks while pt is travelling for the holidays. Added self MET but told pt to continue with same HEP otherwise since he has pain relief and has been feeling progress thus far.      Lumbar Exercises: Aerobic   Nustep L6 x 5 min      Lumbar Exercises: Prone   Other Prone Lumbar Exercises Prone press ups 2 x 10      Lumbar Exercises: Quadruped   [place]/Old Horse 15 reps    Other Quadruped Lumbar Exercises Fire hydrant x 15 each LE with cues for posture and core activation      Manual Therapy   Manual Therapy Muscle Energy Technique    Manual therapy comments Long sit test performed with apparent RLE longer than LLE but no difference/rotation  noted from supine to long sitting. Leg length 36 inches from ASIS to medial malleolus bilaterally but L ASIS slightly cephaled compared to R ASIS - TTP along L SI joint posteriorly and with gentle superior/PA mobs of R ASIS to tolerance    Muscle Energy Technique L HS and R quad 8 x 5 sec holds with some pain relief following                  PT Education - 10/12/20 1239    Education Details Reviewed HEP over the next few weeks while pt is travelling for the holidays. Added self MET but told pt to continue with same HEP otherwise since he has pain relief and has been feeling progress thus far. Discussed POC to continue 11/06/2020 for 2x/week x 6 weeks once pt returns.    Person(s) Educated Patient    Methods Explanation;Demonstration;Tactile cues;Verbal cues    Comprehension Verbalized understanding;Returned demonstration;Verbal cues required;Tactile cues required            PT Short Term Goals - 10/12/20 1145      PT SHORT TERM GOAL #1   Title pt will verbalize daily performance of HEP exericses/stretches    Baseline began educating at eval    Time 3    Period Weeks    Status Achieved    Target Date 09/09/20      PT SHORT TERM GOAL #2   Title pt will demo proper resting posture at least 50% of the time without cues    Baseline poor resting posture at eval; still requires cues to correct posture but self corrects occasionally    Time 3    Period Weeks    Status Achieved    Target Date 09/09/20             PT Long Term Goals - 10/12/20 1145      PT LONG TERM GOAL #1   Title Bil HS flexibility to at least 70 deg    Baseline 40 bil at eval    Time 8    Period Weeks  Status Achieved      PT LONG TERM GOAL #2   Title gross hip strength to 5/5    Baseline see flowsheet    Time 8    Period Weeks    Status On-going      PT LONG TERM GOAL #3   Title pt will feel ready to participate in play with grand kids in time for holiday visit    Baseline will be able to  play with grandkids but has to be cautious still    Time 8    Period Weeks    Status Partially Met      PT LONG TERM GOAL #4   Title average pain with ADLs <=4/10    Baseline 6/10 at rest at eval with meds in system. 10/12/2020: stays < 5/10 throughout the day    Time 8    Period Weeks    Status Partially Met      PT LONG TERM GOAL #5   Title pt will be independent in long term HEP for continued strengthening    Baseline will progress and establish as appropriate    Time 8    Period Weeks    Status On-going                 Plan - 10/12/20 1240    Clinical Impression Statement Patient tolerated treatment session well without adverse effects or significant increase in pain. Pt presents with TTP with palpation and gentle joint mobilization along lumbar vertebrae, sacrum, and R ASIS. Pt expressed continued pain but less intensity following interventions, manual techniques, and MET. Pt's BLE strength upon MMT assessment at 4+/5, and he presents with improvement in lumbar AROM in all planes but has lumbar pain with FL and B SB (pain worse with L SB). Pt has progressed well but will benefit from continued skilled PT to further increase strength and mobility for improved tolerance while playing with grandchildren, performing daily activities, and progressing his walking routine. Pt would like to continue with skilled PT but will be out of town for the holidays until 11/06/2020 so pt and PT discussed POC once he returns.    Personal Factors and Comorbidities Time since onset of injury/illness/exacerbation;Fitness    Examination-Activity Limitations Bed Mobility;Sit;Bend;Sleep;Squat;Carry;Stairs;Stand;Lift;Locomotion Level    Examination-Participation Restrictions Driving;Community Activity    Stability/Clinical Decision Making Stable/Uncomplicated    Rehab Potential Good    PT Frequency 2x / week    PT Duration 8 weeks    PT Treatment/Interventions ADLs/Self Care Home  Management;Cryotherapy;Electrical Stimulation;Moist Heat;Stair training;Functional mobility training;Therapeutic activities;Therapeutic exercise;Neuromuscular re-education;Manual techniques;Patient/family education;Passive range of motion;Dry needling;Taping;Joint Manipulations;Spinal Manipulations    PT Next Visit Plan Re-evaluation due to pt returning after several weeks. Review HEP, continue with lumbar stretches, progress core strengthening,  swiss ball marches. Reassess iliac crest/ASIS level, leg length, and long sit test if indicated.    PT Home Exercise Plan [id number] - LTR, SKTC, levator scap stretch, PPT, scapular retraction, upper trap stretch, PPT with marches in supine, step ups, hip hinge (standing and seated), rows, pallof press, cat/camel, quadruped alternating UE, prone lying and prone press up, self BLE MET using broom    Consulted and Agree with Plan of Care Patient           Patient will benefit from skilled therapeutic intervention in order to improve the following deficits and impairments:  Decreased range of motion,Difficulty walking,Increased muscle spasms,Decreased activity tolerance,Pain,Improper body mechanics,Impaired flexibility,Decreased strength,Postural dysfunction  Visit Diagnosis: Chronic left-sided low back pain  without sciatica  Muscle weakness (generalized)  Abnormal posture     Problem List Patient Active Problem List   Diagnosis Date Noted  . Pulmonary embolism ([place]) 12/14/2019  . Prediabetes 12/26/2012  . Elevated LDL cholesterol level 12/26/2012  . Hyperlipidemia LDL goal < 100 12/26/2012  . Chronic back pain 12/26/2012   Check all possible CPT codes: 09311- Therapeutic Exercise, [phone]- Neuro Re-education, [phone] - Gait Training, [phone] - Manual Therapy, [phone] - Therapeutic Activities, [phone] - Self Care, [phone] - Electrical stimulation (Manual), [id number] - Iontophoresis, [id number] - Ultrasound, [id number] - Physical performance training and [id number] - Aquatic  therapy        Haydee Monica, PT, DPT 10/12/20 12:52 PM  [facility] [address] [place], [place], [address] Phone: [phone]   Fax:  [phone]  Name: Espen Bethel MRN: [id number] Date of Birth: 05-05-1961

## 2020-10-17 ENCOUNTER — Ambulatory Visit: Payer: Medicaid Other

## 2020-10-17 ENCOUNTER — Other Ambulatory Visit: Payer: Self-pay

## 2020-10-17 DIAGNOSIS — M6281 Muscle weakness (generalized): Secondary | ICD-10-CM

## 2020-10-17 DIAGNOSIS — M545 Low back pain, unspecified: Secondary | ICD-10-CM | POA: Diagnosis not present

## 2020-10-17 DIAGNOSIS — G8929 Other chronic pain: Secondary | ICD-10-CM

## 2020-10-17 DIAGNOSIS — R293 Abnormal posture: Secondary | ICD-10-CM

## 2020-10-17 NOTE — Therapy (Signed)
[place], [place], [address] Phone: [phone]   Fax:  [phone]  Physical Therapy Treatment  Patient Details  Name: Shane Oliver MRN: [id number] Date of Birth: [birth year] Referring Provider (PT): Raelyn Number, [place]   Encounter Date: 10/17/2020   PT End of Session - 10/17/20 1151    Visit Number 12    Number of Visits 23    Date for PT Re-Evaluation 12/16/20    Authorization Type [place] Medicaid healthy blue- 27 visits/year, re-auth submitted 12/9    PT Start Time 7622   pt arrived late   PT Stop Time 1215    PT Time Calculation (min) 30 min    Activity Tolerance Patient tolerated treatment well    Behavior During Therapy [facility] for tasks assessed/performed           Past Medical History:  Diagnosis Date  . Asthma   . Back pain, chronic   . Chronic back pain 12/26/2012  . Hyperlipidemia LDL goal < 100 12/26/2012    History reviewed. No pertinent surgical history.  There were no vitals filed for this visit.   Subjective Assessment - 10/17/20 1150    Subjective "My lower back is hurting today. I think maybe I just walked too much because I walked 6,000 steps on Sunday and it's been hurting the past couple days."    How long can you walk comfortably? 2-3 min    Patient Stated Goals play with grand kids- ill be there over the holidays & summer    Currently in Pain? Yes    Pain Score 7     Pain Location Back    Pain Orientation Lower;Mid    Pain Descriptors / Indicators Aching    Pain Onset More than a month ago    Pain Onset More than a month ago              [facility] PT Assessment - 10/17/20 0001      Assessment   Medical Diagnosis LBP    Referring Provider (PT) Raelyn Number, PA                         [facility] Adult PT Treatment/Exercise - 10/17/20 0001      Lumbar Exercises: Stretches   Hip Flexor Stretch Right;Left;2 reps;30 seconds    Hip Flexor Stretch  Limitations in prone with strap      Lumbar Exercises: Aerobic   Nustep L5 x 6 min      Lumbar Exercises: Standing   Other Standing Lumbar Exercises Pallof press with rotation using 1 blue theraband 15x each direction; cues for form      Lumbar Exercises: Prone   Other Prone Lumbar Exercises POE x 1 min      Lumbar Exercises: Quadruped   Other Quadruped Lumbar Exercises Fire hydrant x 15 each LE with cues for posture and core activation      Manual Therapy   Manual Therapy Muscle Energy Technique    Joint Mobilization Gentle sacral (L side) joint mobilizations    Soft tissue mobilization STM and myofascial relase to proximal R hip flexors due to spasm/pain after MET    Muscle Energy Technique L HS and R quad 8 x 5 sec holds                    PT Short Term Goals - 10/12/20 1145      PT SHORT TERM  GOAL #1   Title pt will verbalize daily performance of HEP exericses/stretches    Baseline began educating at eval    Time 3    Period Weeks    Status Achieved    Target Date 09/09/20      PT SHORT TERM GOAL #2   Title pt will demo proper resting posture at least 50% of the time without cues    Baseline poor resting posture at eval; still requires cues to correct posture but self corrects occasionally    Time 3    Period Weeks    Status Achieved    Target Date 09/09/20             PT Long Term Goals - 10/12/20 1145      PT LONG TERM GOAL #1   Title Bil HS flexibility to at least 70 deg    Baseline 40 bil at eval    Time 8    Period Weeks    Status Achieved      PT LONG TERM GOAL #2   Title gross hip strength to 5/5    Baseline see flowsheet    Time 8    Period Weeks    Status On-going      PT LONG TERM GOAL #3   Title pt will feel ready to participate in play with grand kids in time for holiday visit    Baseline will be able to play with grandkids but has to be cautious still    Time 8    Period Weeks    Status Partially Met      PT LONG TERM GOAL  #4   Title average pain with ADLs <=4/10    Baseline 6/10 at rest at eval with meds in system. 10/12/2020: stays < 5/10 throughout the day    Time 8    Period Weeks    Status Partially Met      PT LONG TERM GOAL #5   Title pt will be independent in long term HEP for continued strengthening    Baseline will progress and establish as appropriate    Time 8    Period Weeks    Status On-going                 Plan - 10/17/20 1341    Clinical Impression Statement Treatment session limited due to patient arriving late. Pt tolerated interventions well with no adverse effects or complaints of significant increase in pain. Pt presents with TTP along lumbar spine and sacrum today.    Personal Factors and Comorbidities Time since onset of injury/illness/exacerbation;Fitness    Examination-Activity Limitations Bed Mobility;Sit;Bend;Sleep;Squat;Carry;Stairs;Stand;Lift;Locomotion Level    Examination-Participation Restrictions Driving;Community Activity    Stability/Clinical Decision Making Stable/Uncomplicated    Rehab Potential Good    PT Frequency 2x / week    PT Duration 8 weeks    PT Treatment/Interventions ADLs/Self Care Home Management;Cryotherapy;Electrical Stimulation;Moist Heat;Stair training;Functional mobility training;Therapeutic activities;Therapeutic exercise;Neuromuscular re-education;Manual techniques;Patient/family education;Passive range of motion;Dry needling;Taping;Joint Manipulations;Spinal Manipulations    PT Next Visit Plan Re-evaluation in January due to pt returning after several weeks. Review HEP, continue with lumbar stretches, progress core strengthening,  swiss ball marches. Reassess long sit test if indicated.    PT Home Exercise Plan [id number] - LTR, SKTC, levator scap stretch, PPT, scapular retraction, upper trap stretch, PPT with marches in supine, step ups, hip hinge (standing and seated), rows, pallof press, cat/camel, quadruped alternating UE, prone lying and  prone press up, self BLE  MET using broom    Consulted and Agree with Plan of Care Patient           Patient will benefit from skilled therapeutic intervention in order to improve the following deficits and impairments:  Decreased range of motion,Difficulty walking,Increased muscle spasms,Decreased activity tolerance,Pain,Improper body mechanics,Impaired flexibility,Decreased strength,Postural dysfunction  Visit Diagnosis: Chronic left-sided low back pain without sciatica  Muscle weakness (generalized)  Abnormal posture     Problem List Patient Active Problem List   Diagnosis Date Noted  . Pulmonary embolism ([place]) 12/14/2019  . Prediabetes 12/26/2012  . Elevated LDL cholesterol level 12/26/2012  . Hyperlipidemia LDL goal < 100 12/26/2012  . Chronic back pain 12/26/2012     Haydee Monica, PT, DPT 10/17/20 1:47 PM  [facility] [address] [place], [place], [address] Phone: [phone]   Fax:  [phone]  Name: Dontavion Noxon MRN: [id number] Date of Birth: 05-16-1961

## 2020-10-19 ENCOUNTER — Ambulatory Visit: Payer: Medicaid Other

## 2020-10-19 ENCOUNTER — Other Ambulatory Visit: Payer: Self-pay

## 2020-10-19 DIAGNOSIS — M545 Low back pain, unspecified: Secondary | ICD-10-CM | POA: Diagnosis not present

## 2020-10-19 DIAGNOSIS — R293 Abnormal posture: Secondary | ICD-10-CM

## 2020-10-19 DIAGNOSIS — G8929 Other chronic pain: Secondary | ICD-10-CM

## 2020-10-19 DIAGNOSIS — M6281 Muscle weakness (generalized): Secondary | ICD-10-CM

## 2020-10-19 NOTE — Therapy (Signed)
[place], [place], [address] Phone: [phone]   Fax:  [phone]  Physical Therapy Treatment  Patient Details  Name: Shane Oliver MRN: [id number] Date of Birth: [birth year] Referring Provider (PT): Raelyn Number, [place]   Encounter Date: 10/19/2020   PT End of Session - 10/19/20 1134    Visit Number 13    Number of Visits 23    Date for PT Re-Evaluation 12/16/20    Authorization Type [place] Medicaid healthy blue- 27 visits/year, re-auth submitted 12/9    PT Start Time 1130    PT Stop Time 1211    PT Time Calculation (min) 41 min    Activity Tolerance Patient tolerated treatment well    Behavior During Therapy [facility] for tasks assessed/performed           Past Medical History:  Diagnosis Date  . Asthma   . Back pain, chronic   . Chronic back pain 12/26/2012  . Hyperlipidemia LDL goal < 100 12/26/2012    History reviewed. No pertinent surgical history.  There were no vitals filed for this visit.   Subjective Assessment - 10/19/20 1133    Subjective "I feel good today. It's probably like a 5 in my lower back today. The upper back hurts a little, but it feels so much better."    How long can you walk comfortably? 2-3 min    Patient Stated Goals play with grand kids- ill be there over the holidays & summer    Currently in Pain? Yes    Pain Score 5     Pain Location Back    Pain Orientation Lower;Mid    Pain Descriptors / Indicators Aching    Pain Type Chronic pain    Pain Onset More than a month ago    Pain Onset More than a month ago              [facility] PT Assessment - 10/19/20 0001      Assessment   Medical Diagnosis LBP    Referring Provider (PT) Raelyn Number, PA                         [facility] Adult PT Treatment/Exercise - 10/19/20 0001      Lumbar Exercises: Stretches   Piriformis Stretch Left;3 reps;30 seconds      Lumbar Exercises: Aerobic   Nustep L6 x 6  min      Lumbar Exercises: Standing   Row Strengthening;Both;20 reps;Theraband    Theraband Level (Row) Other (comment)   black   Other Standing Lumbar Exercises Pallof press with rotation using 1 blue theraband 15x each direction; cues for form    Other Standing Lumbar Exercises Tandem half kneeling with perturbations then reaching across body with each UE x 5      Lumbar Exercises: Seated   Other Seated Lumbar Exercises Alternating marches seated on green swiss ball      Lumbar Exercises: Prone   Other Prone Lumbar Exercises Prone B hip ER and EXT x 10      Lumbar Exercises: Quadruped   Madcat/Old Horse 20 reps    Other Quadruped Lumbar Exercises Fire hydrant x 20 each LE with cues for posture and core activation      Manual Therapy   Manual therapy comments No innominate rotation noted with long sit test today    Joint Mobilization Gentle sacral (L side) joint mobilizations    Soft tissue mobilization  STM and myofascial release along lumbar paraspinals and L piriformis and glute med                  PT Education - 10/19/20 1322    Education Details Reviewed HEP over the next few weeks while pt is travelling and added piriformis stretch and prone hip ER with EXT. Confirmed POC to continue when patient returns after 11/06/2020.    Person(s) Educated Patient    Methods Explanation;Demonstration    Comprehension Verbalized understanding;Returned demonstration            PT Short Term Goals - 10/12/20 1145      PT SHORT TERM GOAL #1   Title pt will verbalize daily performance of HEP exericses/stretches    Baseline began educating at eval    Time 3    Period Weeks    Status Achieved    Target Date 09/09/20      PT SHORT TERM GOAL #2   Title pt will demo proper resting posture at least 50% of the time without cues    Baseline poor resting posture at eval; still requires cues to correct posture but self corrects occasionally    Time 3    Period Weeks    Status  Achieved    Target Date 09/09/20             PT Long Term Goals - 10/12/20 1145      PT LONG TERM GOAL #1   Title Bil HS flexibility to at least 70 deg    Baseline 40 bil at eval    Time 8    Period Weeks    Status Achieved      PT LONG TERM GOAL #2   Title gross hip strength to 5/5    Baseline see flowsheet    Time 8    Period Weeks    Status On-going      PT LONG TERM GOAL #3   Title pt will feel ready to participate in play with grand kids in time for holiday visit    Baseline will be able to play with grandkids but has to be cautious still    Time 8    Period Weeks    Status Partially Met      PT LONG TERM GOAL #4   Title average pain with ADLs <=4/10    Baseline 6/10 at rest at eval with meds in system. 10/12/2020: stays < 5/10 throughout the day    Time 8    Period Weeks    Status Partially Met      PT LONG TERM GOAL #5   Title pt will be independent in long term HEP for continued strengthening    Baseline will progress and establish as appropriate    Time 8    Period Weeks    Status On-going                 Plan - 10/19/20 1211    Clinical Impression Statement Patient experienced fatigue and minimal low back pain during interventions but had good tolerance overall. He will be out of town for the holidays but plans to call to schedule an appointment prior to returning.    Personal Factors and Comorbidities Time since onset of injury/illness/exacerbation;Fitness    Examination-Activity Limitations Bed Mobility;Sit;Bend;Sleep;Squat;Carry;Stairs;Stand;Lift;Locomotion Level    Examination-Participation Restrictions Driving;Community Activity    Stability/Clinical Decision Making Stable/Uncomplicated    Rehab Potential Good    PT Frequency 2x / week  PT Duration 8 weeks    PT Treatment/Interventions ADLs/Self Care Home Management;Cryotherapy;Electrical Stimulation;Moist Heat;Stair training;Functional mobility training;Therapeutic activities;Therapeutic  exercise;Neuromuscular re-education;Manual techniques;Patient/family education;Passive range of motion;Dry needling;Taping;Joint Manipulations;Spinal Manipulations    PT Next Visit Plan Re-evaluation in January due to pt returning after several weeks. Review HEP, continue with lumbar stretches, progress core strengthening. Reassess long sit test if indicated.    PT Home Exercise Plan [id number] - LTR, SKTC, levator scap stretch, PPT, scapular retraction, upper trap stretch, PPT with marches in supine, step ups, hip hinge (standing and seated), rows, pallof press, cat/camel, quadruped alternating UE, prone lying and prone press up, self BLE MET using broom, piriformis stretch, prone hip ER and EXT    Consulted and Agree with Plan of Care Patient           Patient will benefit from skilled therapeutic intervention in order to improve the following deficits and impairments:  Decreased range of motion,Difficulty walking,Increased muscle spasms,Decreased activity tolerance,Pain,Improper body mechanics,Impaired flexibility,Decreased strength,Postural dysfunction  Visit Diagnosis: Chronic left-sided low back pain without sciatica  Muscle weakness (generalized)  Abnormal posture     Problem List Patient Active Problem List   Diagnosis Date Noted  . Pulmonary embolism ([place]) 12/14/2019  . Prediabetes 12/26/2012  . Elevated LDL cholesterol level 12/26/2012  . Hyperlipidemia LDL goal < 100 12/26/2012  . Chronic back pain 12/26/2012     Haydee Monica, PT, DPT 10/19/20 1:28 PM  [facility] [address] [place], [place], [address] Phone: [phone]   Fax:  [phone]  Name: Shane Oliver MRN: [id number] Date of Birth: 11-18-1960

## 2020-11-07 ENCOUNTER — Ambulatory Visit: Payer: Medicaid Other | Admitting: Physical Therapy

## 2020-11-09 ENCOUNTER — Telehealth: Payer: Self-pay

## 2020-11-09 ENCOUNTER — Ambulatory Visit: Payer: Medicaid Other | Admitting: Physical Therapy

## 2020-11-09 NOTE — Telephone Encounter (Signed)
PT called and left voicemail for patient regarding no show and informed him of attendance policy. Patient had cancelled his appointment 11/07/2020 due to waiting for results from Covid-19 test. PT requested that pt calls back regarding results and if he will be at his next appointment 11/14/2020.  Rhea Bleacher, PT, DPT 11/09/20 11:53 AM

## 2020-11-14 ENCOUNTER — Other Ambulatory Visit: Payer: Self-pay

## 2020-11-14 ENCOUNTER — Ambulatory Visit: Payer: Medicaid Other | Attending: Physician Assistant

## 2020-11-14 DIAGNOSIS — M545 Low back pain, unspecified: Secondary | ICD-10-CM | POA: Insufficient documentation

## 2020-11-14 DIAGNOSIS — M6281 Muscle weakness (generalized): Secondary | ICD-10-CM | POA: Diagnosis present

## 2020-11-14 DIAGNOSIS — R293 Abnormal posture: Secondary | ICD-10-CM | POA: Insufficient documentation

## 2020-11-14 DIAGNOSIS — G8929 Other chronic pain: Secondary | ICD-10-CM | POA: Diagnosis present

## 2020-11-14 NOTE — Therapy (Signed)
[place], [place], [address] Phone: [phone]   Fax:  [phone]  Physical Therapy Treatment  Patient Details  Name: Shane Oliver MRN: [id number] Date of Birth: 1961-11-04 Referring Provider (PT): Raelyn Number, [place]   Encounter Date: 11/14/2020   PT End of Session - 11/14/20 1138    Visit Number 14    Number of Visits 23    Date for PT Re-Evaluation 12/16/20    Authorization Type [place] Medicaid healthy blue- 27 visits/year    Authorization Time Period 11/06/2020 - 12/29/2020    Authorization - Visit Number 1    Authorization - Number of Visits 16    PT Start Time 1132    PT Stop Time 1216    PT Time Calculation (min) 44 min    Activity Tolerance Patient tolerated treatment well    Behavior During Therapy [facility] for tasks assessed/performed           Past Medical History:  Diagnosis Date  . Asthma   . Back pain, chronic   . Chronic back pain 12/26/2012  . Hyperlipidemia LDL goal < 100 12/26/2012    History reviewed. No pertinent surgical history.  There were no vitals filed for this visit.   Subjective Assessment - 11/14/20 1353    Subjective "I didn't do the exercises while I was travelling because I completely forgot about them while I was with family. I had to take medicine more because my back was hurting more especially with the cold and rain." Pt states he plans to back to performing HEP regularly now that he is home. He also expresses interest in TPDN again as able.    How long can you walk comfortably? 2-3 min    Patient Stated Goals play with grand kids- ill be there over the holidays & summer    Currently in Pain? Yes    Pain Score --   5.5/10   Pain Location Back    Pain Orientation Lower;Left    Pain Descriptors / Indicators Aching    Pain Type Chronic pain    Pain Onset More than a month ago    Pain Onset More than a month ago              [facility] PT Assessment - 11/14/20  0001      Assessment   Medical Diagnosis LBP    Referring Provider (PT) Raelyn Number, PA      AROM   Overall AROM Comments Pain with SB (L > R)    Lumbar Flexion ~3 inches proximal to medial malleoli    Lumbar Extension WFL    Lumbar - Right Side Bend to superior pole of patella    Lumbar - Left Side Bend to superior pole of patella    Lumbar - Right Rotation WFL    Lumbar - Left Rotation [facility]      Strength   Right Hip Flexion 4+/5    Right Hip Extension 4+/5    Right Hip ABduction 4+/5    Right Hip ADduction 4+/5    Left Hip Flexion 4+/5    Left Hip ABduction 4+/5   minimal pain in L hip   Left Hip ADduction 4+/5    Right Knee Flexion 5/5    Right Knee Extension 5/5    Left Knee Flexion 5/5    Left Knee Extension 5/5    Right Ankle Dorsiflexion 5/5    Right Ankle Plantar Flexion 5/5  Left Ankle Dorsiflexion 5/5    Left Ankle Plantar Flexion 5/5                         [facility] Adult PT Treatment/Exercise - 11/14/20 0001      Self-Care   Self-Care Other Self-Care Comments    Other Self-Care Comments  Reviewed HEP      Lumbar Exercises: Stretches   Other Lumbar Stretch Exercise Rhomboid doorknob stretch 3 x 30 sec. Doorway quadratus lumborum stretch 2 x 30 sec each direction    Other Lumbar Stretch Exercise open book stretch 10 x 5 sec hold each direction      Lumbar Exercises: Aerobic   Nustep L7 x 5 min      Lumbar Exercises: Quadruped   Madcat/Old Horse 20 reps      Manual Therapy   Manual Therapy Myofascial release;Joint mobilization;Soft tissue mobilization    Joint Mobilization Gentle thoracic and lumbar PA joint mobilizations grades I-III    Soft tissue mobilization STM, myofascial release, and IASTM along R thoracic paraspinals and bilateral QL                    PT Short Term Goals - 10/12/20 1145      PT SHORT TERM GOAL #1   Title pt will verbalize daily performance of HEP exericses/stretches    Baseline began educating at  eval    Time 3    Period Weeks    Status Achieved    Target Date 09/09/20      PT SHORT TERM GOAL #2   Title pt will demo proper resting posture at least 50% of the time without cues    Baseline poor resting posture at eval; still requires cues to correct posture but self corrects occasionally    Time 3    Period Weeks    Status Achieved    Target Date 09/09/20             PT Long Term Goals - 11/14/20 2335      PT LONG TERM GOAL #1   Title Bil HS flexibility to at least 70 deg    Baseline 40 bil at eval    Time 8    Period Weeks    Status Achieved      PT LONG TERM GOAL #2   Title gross hip strength to 5/5    Baseline see flowsheet    Time 8    Period Weeks    Status On-going      PT LONG TERM GOAL #3   Title pt will feel ready to participate in play with grand kids in time for holiday visit    Baseline will be able to play with grandkids but has to be cautious still    Time 8    Period Weeks    Status Achieved      PT LONG TERM GOAL #4   Title average pain with ADLs <=4/10    Baseline 6/10 at rest at eval with meds in system. 10/12/2020: stays < 5/10 throughout the day    Time 8    Period Weeks    Status Partially Met      PT LONG TERM GOAL #5   Title pt will be independent in long term HEP for continued strengthening    Baseline will progress and establish as appropriate    Time 8    Period Weeks    Status On-going  Plan - 11/14/20 1347    Clinical Impression Statement Patient returns for first visit since 10/19/2020 due to travelling for the holidays. He presents with TTP and tightness along bilateral QL and R thoracic paraspinals. He demonstrates some improvement in LE strength grossly but has a minimal limitation in active lumbar/trunk sidebending since previous measurement due to pulling along quadratus lumborum. He shows interest in trigger point dry needling again due to positive response previously. He reports "I'm already  feeling a little better" towards end of session and should continue to benefit from skilled PT intervention to address deficits and progress towards independence with HEP for maintained symptom reduction.    Personal Factors and Comorbidities Time since onset of injury/illness/exacerbation;Fitness    Examination-Activity Limitations Bed Mobility;Sit;Bend;Sleep;Squat;Carry;Stairs;Stand;Lift;Locomotion Level    Examination-Participation Restrictions Driving;Community Activity    Stability/Clinical Decision Making Stable/Uncomplicated    Rehab Potential Good    PT Frequency 2x / week    PT Duration 8 weeks    PT Treatment/Interventions ADLs/Self Care Home Management;Cryotherapy;Electrical Stimulation;Moist Heat;Stair training;Functional mobility training;Therapeutic activities;Therapeutic exercise;Neuromuscular re-education;Manual techniques;Patient/family education;Passive range of motion;Dry needling;Taping;Joint Manipulations;Spinal Manipulations    PT Next Visit Plan Review HEP, continue with lumbar stretches, progress core strengthening. Reassess long sit test if indicated. TPDN?    PT Home Exercise Plan [id number] - LTR, SKTC, levator scap stretch, PPT, scapular retraction, upper trap stretch, PPT with marches in supine, step ups, hip hinge (standing and seated), rows, pallof press, cat/camel, quadruped alternating UE, prone lying and prone press up, self BLE MET using broom, piriformis stretch, prone hip ER and EXT    Consulted and Agree with Plan of Care Patient           Patient will benefit from skilled therapeutic intervention in order to improve the following deficits and impairments:  Decreased range of motion,Difficulty walking,Increased muscle spasms,Decreased activity tolerance,Pain,Improper body mechanics,Impaired flexibility,Decreased strength,Postural dysfunction  Visit Diagnosis: Chronic left-sided low back pain without sciatica  Muscle weakness (generalized)  Abnormal  posture     Problem List Patient Active Problem List   Diagnosis Date Noted  . Pulmonary embolism ([place]) 12/14/2019  . Prediabetes 12/26/2012  . Elevated LDL cholesterol level 12/26/2012  . Hyperlipidemia LDL goal < 100 12/26/2012  . Chronic back pain 12/26/2012     Haydee Monica, PT, DPT 11/14/20 11:48 PM  [place] [facility] [address] [place], [place], [address] Phone: [phone]   Fax:  [phone]  Name: Shane Oliver MRN: [id number] Date of Birth: July 19, 1961

## 2020-11-16 ENCOUNTER — Ambulatory Visit: Payer: Medicaid Other | Admitting: Physical Therapy

## 2020-11-16 ENCOUNTER — Other Ambulatory Visit: Payer: Self-pay

## 2020-11-16 ENCOUNTER — Encounter: Payer: Self-pay | Admitting: Physical Therapy

## 2020-11-16 DIAGNOSIS — M545 Low back pain, unspecified: Secondary | ICD-10-CM | POA: Diagnosis not present

## 2020-11-16 DIAGNOSIS — G8929 Other chronic pain: Secondary | ICD-10-CM

## 2020-11-16 DIAGNOSIS — R293 Abnormal posture: Secondary | ICD-10-CM

## 2020-11-16 DIAGNOSIS — M6281 Muscle weakness (generalized): Secondary | ICD-10-CM

## 2020-11-16 NOTE — Therapy (Signed)
[place], [place], [address] Phone: [phone]   Fax:  [phone]  Physical Therapy Treatment  Patient Details  Name: Shane Oliver MRN: [id number] Date of Birth: 04-Aug-1961 Referring Provider (PT): Raelyn Number, [place]   Encounter Date: 11/16/2020   PT End of Session - 11/16/20 1141    Visit Number 15    Number of Visits 23    Date for PT Re-Evaluation 12/16/20    Authorization Type [place] Medicaid healthy blue- 27 visits/year    Authorization Time Period 11/06/2020 - 12/29/2020    Authorization - Visit Number 2    Authorization - Number of Visits 16    PT Start Time 3382    PT Stop Time 1215    PT Time Calculation (min) 40 min           Past Medical History:  Diagnosis Date  . Asthma   . Back pain, chronic   . Chronic back pain 12/26/2012  . Hyperlipidemia LDL goal < 100 12/26/2012    History reviewed. No pertinent surgical history.  There were no vitals filed for this visit.   Subjective Assessment - 11/16/20 1139    Subjective Pain is 6/10 today. Usually the meds help more but it was really hurting this morning.    Currently in Pain? Yes    Pain Score 6     Pain Location Back    Pain Orientation Left;Lower    Pain Descriptors / Indicators Aching    Pain Type Chronic pain    Aggravating Factors  turning over    Pain Relieving Factors meds                             [facility] Adult PT Treatment/Exercise - 11/16/20 0001      Lumbar Exercises: Stretches   Hip Flexor Stretch Right;Left;2 reps;30 seconds    Hip Flexor Stretch Limitations in prone with strap    Quadruped Mid Back Stretch Limitations childs pose x 3 rocking into quadruped   lateral childs pose x 2 each     Lumbar Exercises: Aerobic   Nustep L5 x 6 minutes      Lumbar Exercises: Standing   Row Strengthening;Both;20 reps;Theraband    Theraband Level (Row) Level 3 (Green)    Shoulder Extension 20 reps     Theraband Level (Shoulder Extension) Level 3 (Green)    Other Standing Lumbar Exercises palloff press 2 green bands      Lumbar Exercises: Supine   Pelvic Tilt 10 reps    Bridge 10 reps    Bridge Limitations with PPT      Lumbar Exercises: Prone   Other Prone Lumbar Exercises POE x 1 min      Lumbar Exercises: Quadruped   Madcat/Old Horse 10 reps                    PT Short Term Goals - 10/12/20 1145      PT SHORT TERM GOAL #1   Title pt will verbalize daily performance of HEP exericses/stretches    Baseline began educating at eval    Time 3    Period Weeks    Status Achieved    Target Date 09/09/20      PT SHORT TERM GOAL #2   Title pt will demo proper resting posture at least 50% of the time without cues    Baseline poor resting posture at  eval; still requires cues to correct posture but self corrects occasionally    Time 3    Period Weeks    Status Achieved    Target Date 09/09/20             PT Long Term Goals - 11/14/20 2335      PT LONG TERM GOAL #1   Title Bil HS flexibility to at least 70 deg    Baseline 40 bil at eval    Time 8    Period Weeks    Status Achieved      PT LONG TERM GOAL #2   Title gross hip strength to 5/5    Baseline see flowsheet    Time 8    Period Weeks    Status On-going      PT LONG TERM GOAL #3   Title pt will feel ready to participate in play with grand kids in time for holiday visit    Baseline will be able to play with grandkids but has to be cautious still    Time 8    Period Weeks    Status Achieved      PT LONG TERM GOAL #4   Title average pain with ADLs <=4/10    Baseline 6/10 at rest at eval with meds in system. 10/12/2020: stays < 5/10 throughout the day    Time 8    Period Weeks    Status Partially Met      PT LONG TERM GOAL #5   Title pt will be independent in long term HEP for continued strengthening    Baseline will progress and establish as appropriate    Time 8    Period Weeks    Status  On-going                 Plan - 11/16/20 1208    Clinical Impression Statement Pt reports increased pain today and no improvment with pain meds. Continued with standing and supine core strength. Increased stretching fous today due to increased pain level.    PT Next Visit Plan Review HEP, continue with lumbar stretches, progress core strengthening. Reassess long sit test if indicated. TPDN?    PT Home Exercise Plan [id number] - LTR, SKTC, levator scap stretch, PPT, scapular retraction, upper trap stretch, PPT with marches in supine, step ups, hip hinge (standing and seated), rows, pallof press, cat/camel, quadruped alternating UE, prone lying and prone press up, self BLE MET using broom, piriformis stretch, prone hip ER and EXT    Consulted and Agree with Plan of Care Patient           Patient will benefit from skilled therapeutic intervention in order to improve the following deficits and impairments:     Visit Diagnosis: Chronic left-sided low back pain without sciatica  Muscle weakness (generalized)  Abnormal posture     Problem List Patient Active Problem List   Diagnosis Date Noted  . Pulmonary embolism ([place]) 12/14/2019  . Prediabetes 12/26/2012  . Elevated LDL cholesterol level 12/26/2012  . Hyperlipidemia LDL goal < 100 12/26/2012  . Chronic back pain 12/26/2012    Dorene Ar, [place] 11/16/2020, 12:22 PM  [facility] [address] [place], [place], [address] Phone: [phone]   Fax:  [phone]  Name: Kayshaun Polanco MRN: [id number] Date of Birth: Apr 26, 1961

## 2020-12-05 ENCOUNTER — Other Ambulatory Visit: Payer: Self-pay

## 2020-12-05 ENCOUNTER — Ambulatory Visit: Payer: Medicaid Other | Attending: Physician Assistant

## 2020-12-05 DIAGNOSIS — R293 Abnormal posture: Secondary | ICD-10-CM | POA: Insufficient documentation

## 2020-12-05 DIAGNOSIS — M6281 Muscle weakness (generalized): Secondary | ICD-10-CM | POA: Diagnosis present

## 2020-12-05 DIAGNOSIS — M545 Low back pain, unspecified: Secondary | ICD-10-CM | POA: Insufficient documentation

## 2020-12-05 DIAGNOSIS — G8929 Other chronic pain: Secondary | ICD-10-CM | POA: Diagnosis present

## 2020-12-05 NOTE — Therapy (Signed)
[place], [place], [address] Phone: [phone]   Fax:  [phone]  Physical Therapy Treatment  Patient Details  Name: Shane Oliver MRN: [id number] Date of Birth: [birth year] Referring Provider (PT): Raelyn Number, [place]   Encounter Date: 12/05/2020   PT End of Session - 12/05/20 1048    Visit Number 16    Number of Visits 23    Date for PT Re-Evaluation 12/16/20    Authorization Type [place] Medicaid healthy blue- 27 visits/year    Authorization Time Period 11/06/2020 - 12/29/2020    Authorization - Visit Number 3    Authorization - Number of Visits 16    PT Start Time 0093   pt arrived late   PT Stop Time 1130    PT Time Calculation (min) 41 min    Activity Tolerance Patient tolerated treatment well    Behavior During Therapy [facility] for tasks assessed/performed           Past Medical History:  Diagnosis Date  . Asthma   . Back pain, chronic   . Chronic back pain 12/26/2012  . Hyperlipidemia LDL goal < 100 12/26/2012    History reviewed. No pertinent surgical history.  There were no vitals filed for this visit.   Subjective Assessment - 12/05/20 1048    Subjective Pt reports "I feel better today. It's been so sore lately with this cold. I didn't do my stretches when it was cold because I just felt stiff, and it was hurting." Pt reports improvement in symptoms after last session.    How long can you walk comfortably? 2-3 min    Patient Stated Goals play with grand kids- ill be there over the holidays & summer    Currently in Pain? Yes    Pain Score 7     Pain Location Back    Pain Orientation Left;Lower    Pain Descriptors / Indicators Aching    Pain Type Chronic pain    Pain Onset More than a month ago    Pain Score --   5.5   Pain Location Thoracic    Pain Orientation Left    Pain Descriptors / Indicators Aching;Sore;Tightness    Pain Type Chronic pain    Pain Onset More than a month ago               [facility] PT Assessment - 12/05/20 0001      Assessment   Medical Diagnosis LBP    Referring Provider (PT) Raelyn Number, PA                         [facility] Adult PT Treatment/Exercise - 12/05/20 0001      Self-Care   Self-Care Other Self-Care Comments    Other Self-Care Comments  Updated HEP, peripheralization vs centralization, advised pt to assume prone position when radicular symptoms are present to see if symptoms centralize and advised to discontinue if symptoms worsen or further peripheralize, advised pt to follow up with doctor for further examination as his symptoms have somewhat eased but mostly fluctuate and have remained fairly constant throughout PT POC thus far.      Lumbar Exercises: Aerobic   Nustep L6 x 6 min LE and UE      Lumbar Exercises: Standing   Row Strengthening;Both;20 reps;Theraband   2 x 20   Theraband Level (Row) Level 4 (Blue)    Shoulder Extension Strengthening;Both;20 reps;Theraband   2 x  20   Theraband Level (Shoulder Extension) Level 4 (Blue)    Other Standing Lumbar Exercises Pallof press with 2 green therabands x 15 each direction      Lumbar Exercises: Seated   Other Seated Lumbar Exercises Attempted shoulder extension while seated on blue swiss ball but pt experienced increased pain with radicular symptoms in LLE in seated position. He reports his pain has recently been worse when sitting      Lumbar Exercises: Supine   Clam 10 reps    Clam Limitations 10x each LE with green theraband    Bridge with [employer] 15 reps    Bridge with [employer] Limitations cues for glute activation      Lumbar Exercises: Prone   Other Prone Lumbar Exercises POE x 2 min      Manual Therapy   Manual therapy comments Palpation - TTP along L PSIS and lower lumbar SP    Muscle Energy Technique Long sit test revealed posterior rotation of L innominate from supine > longsitting. R hip EXT and L hip FL - pain with L hip FL activation  so focused on R hip EXT. Innominate rotation was not apparent following MET.                  PT Education - 12/05/20 1327    Education Details Updated HEP, peripheralization vs centralization, advised pt to assume prone position when radicular symptoms are present to see if symptoms centralize and advised to discontinue if symptoms worsen or further peripheralize, advised pt to follow up with doctor for further examination as his symptoms have somewhat eased but mostly fluctuate and have remained fairly constant throughout PT POC thus far. Innominate rotation and self-MET.    Person(s) Educated Patient    Methods Explanation;Demonstration    Comprehension Verbalized understanding;Returned demonstration            PT Short Term Goals - 10/12/20 1145      PT SHORT TERM GOAL #1   Title pt will verbalize daily performance of HEP exericses/stretches    Baseline began educating at eval    Time 3    Period Weeks    Status Achieved    Target Date 09/09/20      PT SHORT TERM GOAL #2   Title pt will demo proper resting posture at least 50% of the time without cues    Baseline poor resting posture at eval; still requires cues to correct posture but self corrects occasionally    Time 3    Period Weeks    Status Achieved    Target Date 09/09/20             PT Long Term Goals - 11/14/20 2335      PT LONG TERM GOAL #1   Title Bil HS flexibility to at least 70 deg    Baseline 40 bil at eval    Time 8    Period Weeks    Status Achieved      PT LONG TERM GOAL #2   Title gross hip strength to 5/5    Baseline see flowsheet    Time 8    Period Weeks    Status On-going      PT LONG TERM GOAL #3   Title pt will feel ready to participate in play with grand kids in time for holiday visit    Baseline will be able to play with grandkids but has to be cautious still  Time 8    Period Weeks    Status Achieved      PT LONG TERM GOAL #4   Title average pain with ADLs <=4/10     Baseline 6/10 at rest at eval with meds in system. 10/12/2020: stays < 5/10 throughout the day    Time 8    Period Weeks    Status Partially Met      PT LONG TERM GOAL #5   Title pt will be independent in long term HEP for continued strengthening    Baseline will progress and establish as appropriate    Time 8    Period Weeks    Status On-going                 Plan - 12/05/20 1049    Clinical Impression Statement Patient experienced increased L LBP and radicular symptoms while in sitting position that was eased once standing or in prone position. Pt presents with TTP along L PSIS and along lumbar SP. He responds well to extended positions but had increased pain with prone press ups at this time so will gradually progress prone posiitions as tolerated if he continues to experience centralization with extension. He tolerated exercises well in standing and supine. Long sit test revealed posterior rotation of L innominate that was no longer present following MET, bridges with ball squeeze, and hooklying resisted clamshells. He should continue to benefit from skilled PT to address deficits and improve tolerance with daily activities and sitting position.    Personal Factors and Comorbidities Time since onset of injury/illness/exacerbation;Fitness    Examination-Activity Limitations Bed Mobility;Sit;Bend;Sleep;Squat;Carry;Stairs;Stand;Lift;Locomotion Level    Examination-Participation Restrictions Driving;Community Activity    PT Treatment/Interventions ADLs/Self Care Home Management;Cryotherapy;Electrical Stimulation;Moist Heat;Stair training;Functional mobility training;Therapeutic activities;Therapeutic exercise;Neuromuscular re-education;Manual techniques;Patient/family education;Passive range of motion;Dry needling;Taping;Joint Manipulations;Spinal Manipulations    PT Next Visit Plan Review HEP, continue with lumbar stretches, progress core/hip strengthening. Reassess long sit test if  indicated. TPDN?    PT Home Exercise Plan [id number] - LTR, SKTC, levator scap stretch, PPT, scapular retraction, upper trap stretch, PPT with marches in supine, step ups, hip hinge (standing and seated), rows, pallof press, cat/camel, quadruped alternating UE, prone lying and prone press up, self BLE MET using broom, piriformis stretch, prone hip ER and EXT, hooklying resisted clamshell (green), bridge with ball squeeze    Recommended Other Services Advised pt to reach out to doctor regarding potential follow up - he feels improvement overall but continues to report fairly consistent pain throughout POC thus far.    Consulted and Agree with Plan of Care Patient           Patient will benefit from skilled therapeutic intervention in order to improve the following deficits and impairments:  Decreased range of motion,Difficulty walking,Increased muscle spasms,Decreased activity tolerance,Pain,Improper body mechanics,Impaired flexibility,Decreased strength,Postural dysfunction  Visit Diagnosis: Chronic left-sided low back pain without sciatica  Muscle weakness (generalized)  Abnormal posture     Problem List Patient Active Problem List   Diagnosis Date Noted  . Pulmonary embolism ([place]) 12/14/2019  . Prediabetes 12/26/2012  . Elevated LDL cholesterol level 12/26/2012  . Hyperlipidemia LDL goal < 100 12/26/2012  . Chronic back pain 12/26/2012     Haydee Monica, PT, DPT 12/05/20 1:45 PM  [facility] [address] [place], [place], [address] Phone: [phone]   Fax:  [phone]  Name: Shane Oliver MRN: [id number] Date of Birth: 12/19/1960

## 2020-12-12 ENCOUNTER — Other Ambulatory Visit: Payer: Self-pay

## 2020-12-12 ENCOUNTER — Ambulatory Visit: Payer: Medicaid Other

## 2020-12-12 DIAGNOSIS — M6281 Muscle weakness (generalized): Secondary | ICD-10-CM

## 2020-12-12 DIAGNOSIS — R293 Abnormal posture: Secondary | ICD-10-CM

## 2020-12-12 DIAGNOSIS — M545 Low back pain, unspecified: Secondary | ICD-10-CM

## 2020-12-12 NOTE — Therapy (Signed)
[place], [place], [address] Phone: [phone]   Fax:  [phone]  Physical Therapy Treatment/Discharge Summary  Patient Details  Name: Shane Oliver MRN: [id number] Date of Birth: [birth year] Referring Provider (PT): Raelyn Number, [place]   Encounter Date: 12/12/2020   PT End of Session - 12/12/20 1132    Visit Number 17    Number of Visits 23    Date for PT Re-Evaluation 12/16/20    Authorization Type [place] Medicaid healthy blue- 27 visits/year    Authorization Time Period 11/06/2020 - 12/29/2020    Authorization - Visit Number 4    Authorization - Number of Visits 16    PT Start Time 1133   pt arrived late   PT Stop Time 1214    PT Time Calculation (min) 41 min    Activity Tolerance Patient tolerated treatment well    Behavior During Therapy [facility] for tasks assessed/performed           Past Medical History:  Diagnosis Date  . Asthma   . Back pain, chronic   . Chronic back pain 12/26/2012  . Hyperlipidemia LDL goal < 100 12/26/2012    History reviewed. No pertinent surgical history.  There were no vitals filed for this visit.   Subjective Assessment - 12/12/20 1138    Subjective "The muscle pain feels better, but I still just feel my bones grinding. My house stays so cold right now and it doesn't have good insulation, so I have been so stiff." Pt reports having appointment with physician regarding MRI referral on Thursday.    How long can you walk comfortably? 2-3 min    Patient Stated Goals play with grand kids- ill be there over the holidays & summer    Currently in Pain? Yes    Pain Score 6    9/10 prior to taking pain medication   Pain Location Back    Pain Orientation Left;Lower    Pain Descriptors / Indicators Aching    Pain Type Chronic pain    Pain Radiating Towards shooting pain into LLE usually to L knee but occasionally to L foot. Sometimes into RLE    Pain Onset More than a month  ago    Pain Onset More than a month ago              [facility] PT Assessment - 12/12/20 0001      Assessment   Medical Diagnosis LBP    Referring Provider (PT) Raelyn Number, PA      AROM   Lumbar Flexion ~4 inches proximal to medial malleoli    Lumbar Extension WFL and no pain    Lumbar - Right Side Bend to superior pole of patella with pulling in back    Lumbar - Left Side Bend to superior pole of patella    Lumbar - Right Rotation [facility]    Lumbar - Left Rotation [facility]      Strength   Right Hip Flexion 4+/5    Right Hip Extension 4+/5                         [facility] Adult PT Treatment/Exercise - 12/12/20 0001      Lumbar Exercises: Supine   Dead Bug 5 reps   3 x 4                 PT Education - 12/12/20 1220    Education Details Reviewed  final HEP. Demonstrated and reviewed optimal posture when standing, sitting, and driving - pt reports he has been reclining back when driving to ease his back pain.    Person(s) Educated Patient    Methods Explanation;Demonstration    Comprehension Verbalized understanding;Returned demonstration            PT Short Term Goals - 10/12/20 1145      PT SHORT TERM GOAL #1   Title pt will verbalize daily performance of HEP exericses/stretches    Baseline began educating at eval    Time 3    Period Weeks    Status Achieved    Target Date 09/09/20      PT SHORT TERM GOAL #2   Title pt will demo proper resting posture at least 50% of the time without cues    Baseline poor resting posture at eval; still requires cues to correct posture but self corrects occasionally    Time 3    Period Weeks    Status Achieved    Target Date 09/09/20             PT Long Term Goals - 12/12/20 1217      PT LONG TERM GOAL #1   Title Bil HS flexibility to at least 70 deg    Baseline 40 bil at eval    Time 8    Period Weeks    Status Achieved      PT LONG TERM GOAL #2   Title gross hip strength to 5/5    Baseline see  flowsheet    Time 8    Period Weeks    Status Achieved      PT LONG TERM GOAL #3   Title pt will feel ready to participate in play with grand kids in time for holiday visit    Baseline will be able to play with grandkids but has to be cautious still    Time 8    Period Weeks    Status Achieved      PT LONG TERM GOAL #4   Title average pain with ADLs <=4/10    Baseline 6/10 at rest at eval with meds in system. 10/12/2020: stays < 5/10 throughout the day    Time 8    Period Weeks    Status Not Met      PT LONG TERM GOAL #5   Title pt will be independent in long term HEP for continued strengthening    Baseline will progress and establish as appropriate    Time 8    Period Weeks    Status Achieved                 Plan - 12/12/20 1132    Clinical Impression Statement Patient has continued LBP that is somewhat eased when in prone position, suggesting continued extension bias though he has recently had increased pain in standing vs sitting. He has an appointment to return to PCP 12/14/2020 regarding referral for MRI secondary to persistent LBP. He has progressed well with skilled PT overall and expresses a decrease of the tightness in his back but has complaints that he feels "bones grinding" and is apprehensive about surgery as an option. He reports that his doctor suggested surgery 6 years ago, but he did not want to have surgery then. With PT, patient has experienced temporary relief with continued fluctuation of symptoms and no long term relief. Noted hypomobility of L2-L5 SPs during lumbar PA mobilizations. He had increased symptoms  during UPA at L L3-L4 TP with temporary relief when UPA was performed on R side at same level to allow for decreased approximation at L facet joint. Patient has met majority of functional goals but continues to have constant pain that he attributes to the cold weather. He will be D/C today pending further examination and to allow for independence with HEP  but was informed that he may obtain new referral for skilled PT if indicated.    Personal Factors and Comorbidities Time since onset of injury/illness/exacerbation;Fitness    Examination-Activity Limitations Bed Mobility;Sit;Bend;Sleep;Squat;Carry;Stairs;Stand;Lift;Locomotion Level    Examination-Participation Restrictions Driving;Community Activity    PT Treatment/Interventions ADLs/Self Care Home Management;Cryotherapy;Electrical Stimulation;Moist Heat;Stair training;Functional mobility training;Therapeutic activities;Therapeutic exercise;Neuromuscular re-education;Manual techniques;Patient/family education;Passive range of motion;Dry needling;Taping;Joint Manipulations;Spinal Manipulations    PT Next Visit Plan D/C today    PT Home Exercise Plan [id number] - LTR, SKTC, levator scap stretch, PPT, scapular retraction, upper trap stretch, PPT with marches in supine, step ups, hip hinge (standing and seated), rows, pallof press, cat/camel, quadruped alternating UE, prone lying and prone press up, self BLE MET using broom, piriformis stretch, prone hip ER and EXT, hooklying resisted clamshell (green), bridge with ball squeeze    Consulted and Agree with Plan of Care Patient           Patient will benefit from skilled therapeutic intervention in order to improve the following deficits and impairments:  Decreased range of motion,Difficulty walking,Increased muscle spasms,Decreased activity tolerance,Pain,Improper body mechanics,Impaired flexibility,Decreased strength,Postural dysfunction  Visit Diagnosis: Chronic left-sided low back pain without sciatica  Muscle weakness (generalized)  Abnormal posture     Problem List Patient Active Problem List   Diagnosis Date Noted  . Pulmonary embolism ([place]) 12/14/2019  . Prediabetes 12/26/2012  . Elevated LDL cholesterol level 12/26/2012  . Hyperlipidemia LDL goal < 100 12/26/2012  . Chronic back pain 12/26/2012    PHYSICAL THERAPY DISCHARGE  SUMMARY  Visits from Start of Care: 17  Current functional level related to goals / functional outcomes: See above   Remaining deficits: Patient has met majority of stated goals but has continued to have persistent LBP throughout POC and will return to physician for imaging and further examination.   Education / Equipment: See above  Plan: Patient agrees to discharge.  Patient goals were partially met. Patient is being discharged due to lack of progress.  ?????         Haydee Monica, PT, DPT 12/12/20 1:55 PM  [facility] [address] [place], [place], [address] Phone: [phone]   Fax:  [phone]  Name: Jeray Shugart MRN: [id number] Date of Birth: 1961-03-08

## 2020-12-14 ENCOUNTER — Ambulatory Visit: Payer: Medicaid Other

## 2021-05-05 ENCOUNTER — Emergency Department (HOSPITAL_COMMUNITY)
Admission: EM | Admit: 2021-05-05 | Discharge: 2021-05-05 | Disposition: A | Discharge status: Home / Self Care | Payer: Medicaid Other | Attending: Emergency Medicine | Admitting: Emergency Medicine

## 2021-05-05 ENCOUNTER — Other Ambulatory Visit: Payer: Self-pay

## 2021-05-05 ENCOUNTER — Encounter (HOSPITAL_COMMUNITY): Payer: Self-pay

## 2021-05-05 DIAGNOSIS — Z7901 Long term (current) use of anticoagulants: Secondary | ICD-10-CM | POA: Insufficient documentation

## 2021-05-05 DIAGNOSIS — R059 Cough, unspecified: Secondary | ICD-10-CM | POA: Diagnosis not present

## 2021-05-05 DIAGNOSIS — E119 Type 2 diabetes mellitus without complications: Secondary | ICD-10-CM | POA: Diagnosis not present

## 2021-05-05 DIAGNOSIS — J029 Acute pharyngitis, unspecified: Secondary | ICD-10-CM

## 2021-05-05 DIAGNOSIS — R0982 Postnasal drip: Secondary | ICD-10-CM | POA: Diagnosis not present

## 2021-05-05 DIAGNOSIS — Z20822 Contact with and (suspected) exposure to covid-19: Secondary | ICD-10-CM

## 2021-05-05 DIAGNOSIS — Z7984 Long term (current) use of oral hypoglycemic drugs: Secondary | ICD-10-CM | POA: Insufficient documentation

## 2021-05-05 DIAGNOSIS — J45909 Unspecified asthma, uncomplicated: Secondary | ICD-10-CM | POA: Diagnosis not present

## 2021-05-05 HISTORY — DX: Type 2 diabetes mellitus without complications: E11.9

## 2021-05-05 MED ORDER — NYSTATIN 100000 UNIT/ML MT SUSP: 10.0000 mL | Freq: Three times a day (TID) | OROMUCOSAL | 0 refills | Status: AC

## 2021-05-05 NOTE — ED Provider Notes (Signed)
[facility]  HOSPITAL-EMERGENCY DEPT Provider Note   CSN: [id number] Arrival date & time: 05/05/21  1250     History Chief Complaint  Patient presents with   Sore Throat    Shane Oliver is a 60 y.o. male.  HPI Patient is 60 year old male with past medical history significant for asthma, chronic back pain, DM2  Patient presented today with sore throat and mild cough that is dry nonproductive denies any fevers or chills.  He states his symptoms been ongoing since Tuesday which is 5 days ago.  Denies any lightheadedness or dizziness no shortness of breath or chest pain.  No other associate symptoms.  No aggravating factors.  He states he has tried drinking hot tea with honey which helps only temporarily.  No history of strep in the past.    Past Medical History:  Diagnosis Date   Asthma    Back pain, chronic    Chronic back pain 12/26/2012   Diabetes mellitus without complication (HCC)    Hyperlipidemia LDL goal < 100 12/26/2012    Patient Active Problem List   Diagnosis Date Noted   Pulmonary embolism (HCC) 12/14/2019   Prediabetes 12/26/2012   Elevated LDL cholesterol level 12/26/2012   Hyperlipidemia LDL goal < 100 12/26/2012   Chronic back pain 12/26/2012    History reviewed. No pertinent surgical history.     Family History  Problem Relation Age of Onset   Diabetes Mother    Stroke Father    Diabetes Father     Social History   Tobacco Use   Smoking status: Never   Smokeless tobacco: Never  Vaping Use   Vaping Use: Never used  Substance Use Topics   Alcohol use: No   Drug use: No    Home Medications Prior to Admission medications   Medication Sig Start Date End Date Taking? Authorizing Provider  albuterol (PROVENTIL HFA;VENTOLIN HFA) 108 (90 BASE) MCG/ACT inhaler Inhale 2 puffs into the lungs every 6 (six) hours as needed. For shortness of breath    [provider]  citalopram (CELEXA) 20 MG tablet Take 20 mg by mouth daily.  11/17/19   [provider]  EPINEPHrine (EPIPEN) 0.3 mg/0.3 mL IJ SOAJ injection Inject 0.3 mg into the muscle once.    [provider]  gabapentin (NEURONTIN) 300 MG capsule Take 300 mg by mouth 4 (four) times daily.  11/27/19   [provider]  glimepiride (AMARYL) 1 MG tablet Take 1 mg by mouth daily. 09/20/19   [provider]  pravastatin (PRAVACHOL) 40 MG tablet Take 40 mg by mouth at bedtime. 10/19/19   [provider]  rivaroxaban (XARELTO) 20 MG TABS tablet Take 1 tablet (20 mg total) by mouth daily with supper. Start after the starter pack is completed 01/05/20   Rai, Delene Ruffini, MD  Rivaroxaban 15 & 20 MG TBPK Follow package directions: Take one 15mg  tablet by mouth twice a day. On day 22 (01/05/2020), switch to one 20mg  tablet once a day. Take with food. 12/16/19   Rai, , MD  traMADol (ULTRAM) 50 MG tablet Take 2 tablets (100 mg total) by mouth every 12 (twelve) hours as needed for moderate pain or severe pain. 12/16/19   Delene Ruffini, MD    Allergies    Other  Review of Systems   Review of Systems  Constitutional:  Negative for chills and fever.  HENT:  Positive for postnasal drip and sore throat. Negative for congestion.   Respiratory:  Positive for cough. Negative for shortness of breath.   Cardiovascular:  Negative for chest pain.  Gastrointestinal:  Negative for abdominal pain.  Musculoskeletal:  Negative for neck pain.   Physical Exam Updated Vital Signs BP (!) 139/96 (BP Location: Left Arm)   Pulse 92   Temp 97.9 F (36.6 C) (Oral)   Resp 18   Wt 82.1 kg   SpO2 100%   BMI 28.35 kg/m   Physical Exam Vitals and nursing note reviewed.  Constitutional:      General: He is not in acute distress.    Appearance: Normal appearance. He is not ill-appearing.     Comments: Pleasant well-appearing 60 year old.  In no acute distress.  Sitting comfortably in bed.  Able answer questions appropriately follow commands. No  increased work of breathing. Speaking in full sentences.  HENT:     Head: Normocephalic and atraumatic.     Nose: Nose normal.     Mouth/Throat:     Tonsils: No tonsillar abscesses.     Comments: Posterior pharynx is mildly erythematous with some posterior nasal drainage and cobblestoning.  Tonsils are without exudate Eyes:     General: No scleral icterus.       Right eye: No discharge.        Left eye: No discharge.     Conjunctiva/sclera: Conjunctivae normal.  Cardiovascular:     Rate and Rhythm: Normal rate and regular rhythm.     Pulses: Normal pulses.     Heart sounds: Normal heart sounds.  Pulmonary:     Effort: Pulmonary effort is normal. No respiratory distress.     Breath sounds: No stridor. No wheezing.  Abdominal:     Palpations: Abdomen is soft.     Tenderness: There is no abdominal tenderness.  Musculoskeletal:     Cervical back: Normal range of motion.     Right lower leg: No edema.     Left lower leg: No edema.  Skin:    General: Skin is warm and dry.     Capillary Refill: Capillary refill takes less than 2 seconds.  Neurological:     Mental Status: He is alert and oriented to person, place, and time. Mental status is at baseline.  Psychiatric:        Mood and Affect: Mood normal.        Behavior: Behavior normal.    ED Results / Procedures / Treatments   Labs (all labs ordered are listed, but only abnormal results are displayed) Labs Reviewed - No data to display  EKG None  Radiology No results found.  Procedures Procedures   Medications Ordered in ED Medications - No data to display  ED Course  I have reviewed the triage vital signs and the nursing notes.  Pertinent labs & imaging results that were available during my care of the patient were reviewed by me and considered in my medical decision making (see chart for details).    MDM Rules/Calculators/A&P                          Patient is well-appearing 60 year old male presented today  with sore throat and cough.  Physical exam is relatively unremarkable.  Lungs are clear to auscultation all fields he is not hypoxic has normal vital signs has some posterior nasal drainage with posterior pharyngeal erythema.  No peritonsillar retropharyngeal abscess no evidence of Ludwick's.  His symptoms are mild.  Will discharge home with Magic mouthwash, Tylenol,  ibuprofen recommendations and follow-up with PCP.  Return precautions given.  COVID test pending at time of discharge.  Freeland Pracht was evaluated in Emergency Department on 05/05/2021 for the symptoms described in the history of present illness. He was evaluated in the context of the global COVID-19 pandemic, which necessitated consideration that the patient might be at risk for infection with the SARS-CoV-2 virus that causes COVID-19. Institutional protocols and algorithms that pertain to the evaluation of patients at risk for COVID-19 are in a state of rapid change based on information released by regulatory bodies including the CDC and federal and state organizations. These policies and algorithms were followed during the patient's care in the ED.   Final Clinical Impression(s) / ED Diagnoses Final diagnoses:  None    Rx / DC Orders ED Discharge Orders     None        Gailen Shelter, [place] 05/05/21 1442    Derwood Kaplan, MD 05/07/21 1053

## 2021-05-05 NOTE — Discharge Instructions (Addendum)
Viral Illness TREATMENT  Treatment is directed at relieving symptoms. There is no cure. Antibiotics are not effective, because the infection is caused by a virus, not by bacteria. Treatment may include:  Increased fluid intake. Sports drinks offer valuable electrolytes, sugars, and fluids.  Breathing heated mist or steam (vaporizer or shower).  Eating chicken soup or other clear broths, and maintaining good nutrition.  Getting plenty of rest.  Using gargles or lozenges for comfort.  Increasing usage of your inhaler if you have asthma.  Return to work when your temperature has returned to normal.  Gargle warm salt water and spit it out for sore throat. Take benadryl to decrease sinus secretions. Continue to alternate between Tylenol and ibuprofen for pain and fever control.  Follow Up: Follow up with your primary care doctor in 5-7 days for recheck of ongoing symptoms.  Return to emergency department for emergent changing or worsening of symptoms.  

## 2021-05-05 NOTE — ED Triage Notes (Signed)
Pt reports sore throat since Tuesday night. Pt states he has not had a fever. Pt states when he coughs his "equilibrium goes off".

## 2022-04-07 ENCOUNTER — Emergency Department: Admit: 2022-04-08 | Payer: Auto Insurance (includes no fault)

## 2022-04-07 ENCOUNTER — Emergency Department: Admit: 2022-04-07 | Payer: Auto Insurance (includes no fault)

## 2022-04-07 ENCOUNTER — Inpatient Hospital Stay
Admit: 2022-04-07 | Discharge: 2022-04-08 | Disposition: A | Discharge status: Home / Self Care | Payer: Auto Insurance (includes no fault) | Attending: Emergency Medicine

## 2022-04-07 DIAGNOSIS — S161XXA Strain of muscle, fascia and tendon at neck level, initial encounter: Secondary | ICD-10-CM

## 2022-04-07 LAB — CBC WITH AUTO DIFFERENTIAL
Basophils %: 0.7 % (ref 0.0–1.0)
Basophils Absolute: 0 10*3/uL (ref 0.00–0.20)
Eosinophils %: 1.4 % (ref 0.0–5.0)
Eosinophils Absolute: 0.1 10*3/uL (ref 0.00–0.60)
Hematocrit: 41.8 % — ABNORMAL LOW (ref 42.0–52.0)
Hemoglobin: 14.3 g/dL (ref 14.0–18.0)
Immature Granulocytes #: 0 10*3/uL
Lymphocytes %: 47.5 % — ABNORMAL HIGH (ref 20.0–40.0)
Lymphocytes Absolute: 2.1 10*3/uL (ref 1.1–4.5)
MCH: 30.8 pg (ref 27.0–31.0)
MCHC: 34.2 g/dL (ref 33.0–37.0)
MCV: 90.1 fL (ref 80.0–94.0)
MPV: 10.2 fL (ref 9.4–12.4)
Monocytes %: 8.7 % (ref 0.0–10.0)
Monocytes Absolute: 0.4 10*3/uL (ref 0.00–0.90)
Neutrophils %: 41.5 % — ABNORMAL LOW (ref 50.0–65.0)
Neutrophils Absolute: 1.8 10*3/uL (ref 1.5–7.5)
Platelets: 221 10*3/uL (ref 130–400)
RBC: 4.64 M/uL — ABNORMAL LOW (ref 4.70–6.10)
RDW: 13 % (ref 11.5–14.5)
WBC: 4.4 10*3/uL — ABNORMAL LOW (ref 4.8–10.8)

## 2022-04-07 LAB — COMPREHENSIVE METABOLIC PANEL
ALT: 31 U/L (ref 5–41)
AST: 26 U/L (ref 5–40)
Albumin: 4.7 g/dL (ref 3.5–5.2)
Alkaline Phosphatase: 97 U/L (ref 40–130)
Anion Gap: 11 mmol/L (ref 7–19)
BUN: 10 mg/dL (ref 6–20)
CO2: 25 mmol/L (ref 22–29)
Calcium: 9.3 mg/dL (ref 8.6–10.0)
Chloride: 107 mmol/L (ref 98–111)
Creatinine: 1.2 mg/dL (ref 0.5–1.2)
Est, Glom Filt Rate: >60 (ref >60)
Glucose: 134 mg/dL — ABNORMAL HIGH (ref 74–109)
Potassium: 3.3 mmol/L — ABNORMAL LOW (ref 3.5–5.0)
Sodium: 143 mmol/L (ref 136–145)
Total Bilirubin: 0.5 mg/dL (ref 0.2–1.2)
Total Protein: 7.2 g/dL (ref 6.6–8.7)

## 2022-04-07 LAB — LACTIC ACID: Lactic Acid: 2.1 mmol/L (ref 0.5–1.9)

## 2022-04-07 LAB — TYPE AND SCREEN
ABO/Rh: O POS
Antibody Screen: NEGATIVE

## 2022-04-07 NOTE — Discharge Instructions (Signed)
Take Tylenol 650 mg every 4 hours or try Salonpas or other topical pain relief medications for your musculoskeletal injuries.  Follow-up with your primary care doctor for further treatment and evaluation.  Return immediately to the emergency room for any new or worsening symptoms.
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
## 2022-04-08 MED ORDER — LIDOCAINE 5 % EX PTCH
5 % | MEDICATED_PATCH | Freq: Every day | CUTANEOUS | 0 refills | Status: AC
Start: 2022-04-08 — End: ?

## 2022-04-08 MED ORDER — ACETAMINOPHEN 325 MG PO TABS
325 MG | Freq: Once | ORAL | Status: AC
Start: 2022-04-08 — End: 2022-04-07
  Administered 2022-04-08: 02:00:00 650 mg via ORAL

## 2022-04-08 MED FILL — ACETAMINOPHEN 325 MG PO TABS: 325 MG | ORAL | Qty: 2
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
## 2022-05-02 ENCOUNTER — Ambulatory Visit: Payer: Medicaid Other | Attending: Physician Assistant | Admitting: Physical Therapy

## 2022-05-02 ENCOUNTER — Other Ambulatory Visit: Payer: Self-pay

## 2022-05-02 ENCOUNTER — Encounter: Payer: Self-pay | Admitting: Physical Therapy

## 2022-05-02 DIAGNOSIS — R293 Abnormal posture: Secondary | ICD-10-CM | POA: Diagnosis present

## 2022-05-02 DIAGNOSIS — M546 Pain in thoracic spine: Secondary | ICD-10-CM | POA: Insufficient documentation

## 2022-05-02 DIAGNOSIS — M542 Cervicalgia: Secondary | ICD-10-CM | POA: Diagnosis present

## 2022-05-02 DIAGNOSIS — M6281 Muscle weakness (generalized): Secondary | ICD-10-CM | POA: Diagnosis present

## 2022-05-02 NOTE — Addendum Note (Signed)
Addended by: Champ Mungo on: 05/02/2022 09:47 AM   Modules accepted: Orders
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
## 2022-05-14 ENCOUNTER — Ambulatory Visit: Payer: Medicaid Other | Attending: Physician Assistant | Admitting: Physical Therapy

## 2022-05-14 DIAGNOSIS — M546 Pain in thoracic spine: Secondary | ICD-10-CM | POA: Diagnosis present

## 2022-05-14 DIAGNOSIS — R293 Abnormal posture: Secondary | ICD-10-CM | POA: Diagnosis present

## 2022-05-14 DIAGNOSIS — M6281 Muscle weakness (generalized): Secondary | ICD-10-CM | POA: Diagnosis present

## 2022-05-14 DIAGNOSIS — M542 Cervicalgia: Secondary | ICD-10-CM | POA: Insufficient documentation

## 2022-05-14 DIAGNOSIS — G8929 Other chronic pain: Secondary | ICD-10-CM | POA: Diagnosis present

## 2022-05-14 DIAGNOSIS — M545 Low back pain, unspecified: Secondary | ICD-10-CM | POA: Insufficient documentation
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
## 2022-05-17 ENCOUNTER — Ambulatory Visit: Payer: Medicaid Other

## 2022-05-17 DIAGNOSIS — M6281 Muscle weakness (generalized): Secondary | ICD-10-CM

## 2022-05-17 DIAGNOSIS — M546 Pain in thoracic spine: Secondary | ICD-10-CM

## 2022-05-17 DIAGNOSIS — M542 Cervicalgia: Secondary | ICD-10-CM
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
## 2022-05-21 ENCOUNTER — Ambulatory Visit: Payer: Medicaid Other

## 2022-05-21 DIAGNOSIS — M546 Pain in thoracic spine: Secondary | ICD-10-CM

## 2022-05-21 DIAGNOSIS — M542 Cervicalgia: Secondary | ICD-10-CM | POA: Diagnosis not present

## 2022-05-21 DIAGNOSIS — M6281 Muscle weakness (generalized): Secondary | ICD-10-CM
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
## 2022-05-23 ENCOUNTER — Ambulatory Visit: Payer: Medicaid Other

## 2022-05-23 DIAGNOSIS — M6281 Muscle weakness (generalized): Secondary | ICD-10-CM

## 2022-05-23 DIAGNOSIS — M542 Cervicalgia: Secondary | ICD-10-CM

## 2022-05-23 DIAGNOSIS — M546 Pain in thoracic spine: Secondary | ICD-10-CM
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
## 2022-05-28 ENCOUNTER — Encounter: Payer: Self-pay | Admitting: Physical Therapy

## 2022-05-28 ENCOUNTER — Ambulatory Visit: Payer: Medicaid Other | Admitting: Physical Therapy

## 2022-05-28 DIAGNOSIS — M542 Cervicalgia: Secondary | ICD-10-CM

## 2022-05-28 DIAGNOSIS — M545 Low back pain, unspecified: Secondary | ICD-10-CM

## 2022-05-28 DIAGNOSIS — M546 Pain in thoracic spine: Secondary | ICD-10-CM

## 2022-05-28 DIAGNOSIS — R293 Abnormal posture: Secondary | ICD-10-CM

## 2022-05-28 DIAGNOSIS — M6281 Muscle weakness (generalized): Secondary | ICD-10-CM

## 2022-05-30 ENCOUNTER — Ambulatory Visit: Payer: Medicaid Other | Admitting: Physical Therapy

## 2022-05-30 DIAGNOSIS — G8929 Other chronic pain: Secondary | ICD-10-CM

## 2022-05-30 DIAGNOSIS — M546 Pain in thoracic spine: Secondary | ICD-10-CM

## 2022-05-30 DIAGNOSIS — M542 Cervicalgia: Secondary | ICD-10-CM

## 2022-05-30 DIAGNOSIS — R293 Abnormal posture: Secondary | ICD-10-CM

## 2022-05-30 DIAGNOSIS — M6281 Muscle weakness (generalized): Secondary | ICD-10-CM
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
## 2022-06-04 ENCOUNTER — Ambulatory Visit: Payer: Medicaid Other | Attending: Physician Assistant | Admitting: Physical Therapy

## 2022-06-04 ENCOUNTER — Encounter: Payer: Self-pay | Admitting: Physical Therapy

## 2022-06-04 DIAGNOSIS — G8929 Other chronic pain: Secondary | ICD-10-CM | POA: Diagnosis present

## 2022-06-04 DIAGNOSIS — M545 Low back pain, unspecified: Secondary | ICD-10-CM | POA: Diagnosis present

## 2022-06-04 DIAGNOSIS — R293 Abnormal posture: Secondary | ICD-10-CM | POA: Diagnosis present

## 2022-06-04 DIAGNOSIS — M542 Cervicalgia: Secondary | ICD-10-CM | POA: Insufficient documentation

## 2022-06-04 DIAGNOSIS — M6281 Muscle weakness (generalized): Secondary | ICD-10-CM | POA: Insufficient documentation

## 2022-06-04 DIAGNOSIS — M546 Pain in thoracic spine: Secondary | ICD-10-CM | POA: Diagnosis present
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
## 2022-06-06 ENCOUNTER — Ambulatory Visit: Payer: Medicaid Other | Admitting: Physical Therapy
